# Patient Record
Sex: Male | Born: 1950 | Race: White | Hispanic: No | Marital: Married | Smoking: Never smoker
Health system: Southern US, Community
[De-identification: ages and names within clinical notes are randomized; demographics above are authoritative.]

## PROBLEM LIST (undated history)

## (undated) DIAGNOSIS — G930 Cerebral cysts: Secondary | ICD-10-CM

## (undated) DIAGNOSIS — H269 Unspecified cataract: Secondary | ICD-10-CM

## (undated) DIAGNOSIS — I5189 Other ill-defined heart diseases: Secondary | ICD-10-CM

## (undated) DIAGNOSIS — K831 Obstruction of bile duct: Secondary | ICD-10-CM

## (undated) DIAGNOSIS — I10 Essential (primary) hypertension: Secondary | ICD-10-CM

## (undated) DIAGNOSIS — R0602 Shortness of breath: Secondary | ICD-10-CM

## (undated) DIAGNOSIS — F419 Anxiety disorder, unspecified: Secondary | ICD-10-CM

## (undated) DIAGNOSIS — K219 Gastro-esophageal reflux disease without esophagitis: Secondary | ICD-10-CM

## (undated) DIAGNOSIS — C259 Malignant neoplasm of pancreas, unspecified: Secondary | ICD-10-CM

## (undated) DIAGNOSIS — D649 Anemia, unspecified: Secondary | ICD-10-CM

## (undated) DIAGNOSIS — F329 Major depressive disorder, single episode, unspecified: Secondary | ICD-10-CM

## (undated) DIAGNOSIS — Z87442 Personal history of urinary calculi: Secondary | ICD-10-CM

## (undated) DIAGNOSIS — Z9289 Personal history of other medical treatment: Secondary | ICD-10-CM

## (undated) DIAGNOSIS — F32A Depression, unspecified: Secondary | ICD-10-CM

## (undated) DIAGNOSIS — N189 Chronic kidney disease, unspecified: Secondary | ICD-10-CM

## (undated) DIAGNOSIS — I2699 Other pulmonary embolism without acute cor pulmonale: Secondary | ICD-10-CM

## (undated) DIAGNOSIS — K922 Gastrointestinal hemorrhage, unspecified: Secondary | ICD-10-CM

## (undated) DIAGNOSIS — E119 Type 2 diabetes mellitus without complications: Secondary | ICD-10-CM

## (undated) HISTORY — PX: ELBOW SURGERY: SHX618

## (undated) HISTORY — PX: APPENDECTOMY: SHX54

## (undated) HISTORY — PX: CHOLECYSTECTOMY: SHX55

## (undated) HISTORY — DX: Type 2 diabetes mellitus without complications: E11.9

## (undated) HISTORY — DX: Obstruction of bile duct: K83.1

## (undated) HISTORY — PX: EYE SURGERY: SHX253

## (undated) HISTORY — PX: WHIPPLE PROCEDURE: SHX2667

---

## 2008-11-17 DIAGNOSIS — E119 Type 2 diabetes mellitus without complications: Secondary | ICD-10-CM

## 2008-11-17 HISTORY — DX: Type 2 diabetes mellitus without complications: E11.9

## 2009-11-17 DIAGNOSIS — I2699 Other pulmonary embolism without acute cor pulmonale: Secondary | ICD-10-CM

## 2009-11-17 DIAGNOSIS — C259 Malignant neoplasm of pancreas, unspecified: Secondary | ICD-10-CM

## 2009-11-17 HISTORY — DX: Malignant neoplasm of pancreas, unspecified: C25.9

## 2009-11-17 HISTORY — DX: Other pulmonary embolism without acute cor pulmonale: I26.99

## 2009-11-17 HISTORY — PX: OTHER SURGICAL HISTORY: SHX169

## 2010-10-28 ENCOUNTER — Ambulatory Visit: Payer: Self-pay | Admitting: Internal Medicine

## 2010-10-28 ENCOUNTER — Ambulatory Visit (HOSPITAL_COMMUNITY)
Admission: RE | Admit: 2010-10-28 | Discharge: 2010-10-28 | Payer: Self-pay | Source: Home / Self Care | Attending: Internal Medicine | Admitting: Internal Medicine

## 2010-11-04 ENCOUNTER — Ambulatory Visit: Payer: Self-pay | Admitting: Internal Medicine

## 2010-11-17 DIAGNOSIS — K831 Obstruction of bile duct: Secondary | ICD-10-CM

## 2010-11-17 HISTORY — PX: ERCP W/ PLASTIC STENT PLACEMENT: SHX1522

## 2010-11-17 HISTORY — DX: Obstruction of bile duct: K83.1

## 2011-01-14 ENCOUNTER — Inpatient Hospital Stay (HOSPITAL_COMMUNITY)
Admission: AD | Admit: 2011-01-14 | Discharge: 2011-01-16 | DRG: 919 | Disposition: A | Discharge status: Home / Self Care | Payer: Self-pay | Source: Ambulatory Visit | Attending: Internal Medicine | Admitting: Internal Medicine

## 2011-01-14 DIAGNOSIS — C259 Malignant neoplasm of pancreas, unspecified: Secondary | ICD-10-CM | POA: Diagnosis present

## 2011-01-14 DIAGNOSIS — E119 Type 2 diabetes mellitus without complications: Secondary | ICD-10-CM | POA: Diagnosis present

## 2011-01-14 DIAGNOSIS — K831 Obstruction of bile duct: Secondary | ICD-10-CM

## 2011-01-14 DIAGNOSIS — T85898A Other specified complication of other internal prosthetic devices, implants and grafts, initial encounter: Principal | ICD-10-CM | POA: Diagnosis present

## 2011-01-14 DIAGNOSIS — Y831 Surgical operation with implant of artificial internal device as the cause of abnormal reaction of the patient, or of later complication, without mention of misadventure at the time of the procedure: Secondary | ICD-10-CM | POA: Diagnosis present

## 2011-01-14 DIAGNOSIS — E860 Dehydration: Secondary | ICD-10-CM | POA: Diagnosis present

## 2011-01-14 DIAGNOSIS — E876 Hypokalemia: Secondary | ICD-10-CM | POA: Diagnosis present

## 2011-01-14 DIAGNOSIS — R17 Unspecified jaundice: Secondary | ICD-10-CM

## 2011-01-14 DIAGNOSIS — D649 Anemia, unspecified: Secondary | ICD-10-CM | POA: Diagnosis present

## 2011-01-14 DIAGNOSIS — I10 Essential (primary) hypertension: Secondary | ICD-10-CM | POA: Diagnosis present

## 2011-01-14 LAB — SURGICAL PCR SCREEN
MRSA, PCR: NEGATIVE
Staphylococcus aureus: POSITIVE — AB

## 2011-01-14 LAB — URINALYSIS, ROUTINE W REFLEX MICROSCOPIC
Ketones, ur: 15 mg/dL — AB
Nitrite: NEGATIVE
Specific Gravity, Urine: 1.015 (ref 1.005–1.030)
pH: 5 (ref 5.0–8.0)

## 2011-01-14 LAB — URINE MICROSCOPIC-ADD ON

## 2011-01-15 ENCOUNTER — Inpatient Hospital Stay (HOSPITAL_COMMUNITY): Payer: Self-pay

## 2011-01-15 LAB — COMPREHENSIVE METABOLIC PANEL
ALT: 42 U/L (ref 0–53)
CO2: 27 mEq/L (ref 19–32)
Calcium: 8.4 mg/dL (ref 8.4–10.5)
Creatinine, Ser: 0.78 mg/dL (ref 0.4–1.5)
Glucose, Bld: 93 mg/dL (ref 70–99)
Sodium: 138 mEq/L (ref 135–145)
Total Protein: 5.7 g/dL — ABNORMAL LOW (ref 6.0–8.3)

## 2011-01-15 LAB — PREPARE RBC (CROSSMATCH)

## 2011-01-15 LAB — GLUCOSE, CAPILLARY
Glucose-Capillary: 119 mg/dL — ABNORMAL HIGH (ref 70–99)
Glucose-Capillary: 117 mg/dL — ABNORMAL HIGH (ref 70–99)
Glucose-Capillary: 82 mg/dL (ref 70–99)
Glucose-Capillary: 127 mg/dL — ABNORMAL HIGH (ref 70–99)
Glucose-Capillary: 123 mg/dL — ABNORMAL HIGH (ref 70–99)

## 2011-01-15 LAB — DIFFERENTIAL
Eosinophils Absolute: 0.1 10*3/uL (ref 0.0–0.7)
Eosinophils Relative: 1 % (ref 0–5)
Lymphs Abs: 0.4 10*3/uL — ABNORMAL LOW (ref 0.7–4.0)
Monocytes Absolute: 1.1 10*3/uL — ABNORMAL HIGH (ref 0.1–1.0)
Monocytes Relative: 8 % (ref 3–12)
Neutro Abs: 11.3 10*3/uL — ABNORMAL HIGH (ref 1.7–7.7)

## 2011-01-15 LAB — PROTIME-INR: INR: 1.75 — ABNORMAL HIGH (ref 0.00–1.49)

## 2011-01-15 LAB — MAGNESIUM: Magnesium: 1.5 mg/dL (ref 1.5–2.5)

## 2011-01-15 LAB — URINE CULTURE
Colony Count: NO GROWTH
Culture  Setup Time: 201202290246
Culture: NO GROWTH

## 2011-01-15 LAB — CBC
MCH: 31.3 pg (ref 26.0–34.0)
RDW: 16.3 % — ABNORMAL HIGH (ref 11.5–15.5)
WBC: 12.9 10*3/uL — ABNORMAL HIGH (ref 4.0–10.5)

## 2011-01-15 LAB — ABO/RH: ABO/RH(D): A NEG

## 2011-01-16 DIAGNOSIS — C259 Malignant neoplasm of pancreas, unspecified: Secondary | ICD-10-CM

## 2011-01-16 DIAGNOSIS — R17 Unspecified jaundice: Secondary | ICD-10-CM

## 2011-01-16 DIAGNOSIS — K831 Obstruction of bile duct: Secondary | ICD-10-CM

## 2011-01-16 LAB — CBC
HCT: 26.7 % — ABNORMAL LOW (ref 39.0–52.0)
Hemoglobin: 9.3 g/dL — ABNORMAL LOW (ref 13.0–17.0)
RBC: 3 MIL/uL — ABNORMAL LOW (ref 4.22–5.81)
WBC: 8.5 10*3/uL (ref 4.0–10.5)

## 2011-01-16 LAB — PROTIME-INR: Prothrombin Time: 15 seconds (ref 11.6–15.2)

## 2011-01-16 LAB — COMPREHENSIVE METABOLIC PANEL
ALT: 40 U/L (ref 0–53)
AST: 37 U/L (ref 0–37)
Alkaline Phosphatase: 159 U/L — ABNORMAL HIGH (ref 39–117)
CO2: 25 mEq/L (ref 19–32)
Calcium: 8.3 mg/dL — ABNORMAL LOW (ref 8.4–10.5)
Chloride: 101 mEq/L (ref 96–112)
GFR calc Af Amer: >60 mL/min (ref >60)
GFR calc non Af Amer: >60 mL/min (ref >60)
Glucose, Bld: 104 mg/dL — ABNORMAL HIGH (ref 70–99)
Potassium: 3.2 mEq/L — ABNORMAL LOW (ref 3.5–5.1)
Sodium: 136 mEq/L (ref 135–145)

## 2011-01-16 LAB — DIFFERENTIAL
Basophils Absolute: 0 10*3/uL (ref 0.0–0.1)
Basophils Relative: 1 % (ref 0–1)
Lymphocytes Relative: 6 % — ABNORMAL LOW (ref 12–46)
Monocytes Absolute: 0.7 10*3/uL (ref 0.1–1.0)
Neutro Abs: 7.1 10*3/uL (ref 1.7–7.7)
Neutrophils Relative %: 83 % — ABNORMAL HIGH (ref 43–77)

## 2011-01-16 LAB — GLUCOSE, CAPILLARY: Glucose-Capillary: 106 mg/dL — ABNORMAL HIGH (ref 70–99)

## 2011-01-19 LAB — CROSSMATCH
ABO/RH(D): A NEG
Antibody Screen: NEGATIVE
Unit division: 0
Unit division: 0

## 2011-01-19 LAB — CULTURE, BLOOD (ROUTINE X 2)
Culture: NO GROWTH
Culture: NO GROWTH

## 2011-01-28 LAB — BASIC METABOLIC PANEL
CO2: 25 mEq/L (ref 19–32)
Calcium: 9.4 mg/dL (ref 8.4–10.5)
Chloride: 99 mEq/L (ref 96–112)
Creatinine, Ser: 1.16 mg/dL (ref 0.4–1.5)
GFR calc Af Amer: >60 mL/min (ref >60)
Glucose, Bld: 117 mg/dL — ABNORMAL HIGH (ref 70–99)
Sodium: 134 mEq/L — ABNORMAL LOW (ref 135–145)

## 2011-01-28 LAB — HEPATIC FUNCTION PANEL
AST: 217 U/L — ABNORMAL HIGH (ref 0–37)
Albumin: 3.9 g/dL (ref 3.5–5.2)
Alkaline Phosphatase: 173 U/L — ABNORMAL HIGH (ref 39–117)
Bilirubin, Direct: 8.6 mg/dL — ABNORMAL HIGH (ref 0.0–0.3)
Total Bilirubin: 13.5 mg/dL — ABNORMAL HIGH (ref 0.3–1.2)

## 2011-02-09 NOTE — Op Note (Signed)
  [name], [name]               ACCOUNT NO.:  [id number]  MEDICAL RECORD NO.:  [id number]           PATIENT TYPE:  I  LOCATION:  A335                          FACILITY:  [facility]  PHYSICIAN:  Lionel December, M.D.    DATE OF BIRTH:  19-Nov-1950  DATE OF PROCEDURE:  01/15/2011 DATE OF DISCHARGE:                              OPERATIVE REPORT   PROCEDURE:  Endoscopic-retrograde cholangiopancreatography with biliary stent exchange.  INDICATION:  [name] is a 60 year old Caucasian male who presents with jaundice.  His intrahepatic biliary system is dilated.  He has history of pancreatic carcinoma and had stent placed 10 weeks ago.  It is suspected that this stent has occluded.  He has completed preoperative radiation therapy and midway with this chemo.  He is to have pancreatic surgery by Dr. Dimas Aguas, when he is completed his chemotherapy.  He is undergoing therapeutic ERCP.  Procedures risk were reviewed with the patient.  Informed consent was obtained.  MEDS FOR SEDATION/ANESTHESIA:  Please see anesthesia records for details.  FINDINGS:  Procedure was performed in the OR.  The patient was placed under anesthesia, intubated and turned in semi-prone position. Therapeutic Pentax video duodenoscope was passed to oropharynx without difficulty into esophagus.  Limited view of esophageal mucosa revealed no abnormality.  Antral mucosa was normal.  Pyloric channel was patent. Bulbar and postbulbar mucosa also was normal.  Scope was passed second part of duodenum where the stent was in place and it had lot of debris in the site port as well as and the end port.  It was caught with snared and removed under fluoroscopic control.  Endoscope was passed again and CBD cannulated with RX 44 autumn and 03-5 Hydra Jagwire.  There was cut off in the proximal common bile duct and system was dilated upstream although it was only partially filled just to outline it.  This stricture was estimated to be about 3  cm long.  Using one-step Microvasive system, 10-French 7-cm long plastic stent was placed.  As the stent was deployed, there was a gush of contrast and some bile into the duodenum.  The endoscope was withdrawn.  The patient was extubated, taken to PACU.  He tolerated the procedure well.  FINAL DIAGNOSES: 1. Occluded biliary stents. 2. This stent was removed and another 10-French 7-cm long plastic     biliary stent placed for decompression. 3. The patient with known pancreatic carcinoma with CBD stricture.  RECOMMENDATIONS: 1. We will advance his diet today. 2. We will continue IV antibiotics until blood culture results back. 3. We will repeat his lab studies in a.m. 4. Unless his blood cultures were positive, he should be able to home     in a.m.     Lionel December, M.D.     NR/MEDQ  D:  01/15/2011  T:  01/15/2011  Job:  [id number]  cc:   Dr. Landry Corporal, [place]  DR. [name]  Electronically Signed by Lionel December M.D. on 02/09/2011 12:51:05 PM

## 2011-02-09 NOTE — H&P (Signed)
NAME:  [name], [name]               ACCOUNT NO.:  [id number]  MEDICAL RECORD NO.:  [id number]           PATIENT TYPE:  I  LOCATION:  A335                          FACILITY:  [facility]  PHYSICIAN:  Lionel December, M.D.    DATE OF BIRTH:  1951-08-30  DATE OF ADMISSION:  01/14/2011 DATE OF DISCHARGE:  LH                             HISTORY & PHYSICAL   PRESENTING COMPLAINT:  Fever, jaundice nausea, anorexia in a patient with known history of pancreatic carcinoma who is undergoing preop chemo, who also has biliary stent in place.  Please note, the patient was transferred from emergency room at [facility] to my service here.  HISTORY OF PRESENT ILLNESS:  Saleem is a 60 year old Caucasian male whom I initially saw at request by, Dr. Halina Maidens of [place], [place] for painless jaundice.  He had ERCP with biliary stenting back on October 30, 2011, at [facility].  This took care of his jaundice.  His prior CAT scan had not shown a definite or discrete lesion.  He subsequently had EUS and FNA by, Dr. Lanell Matar, of [facility] and confirmed to have adenocarcinoma.  He was subsequently seen by, Dr. [name] of [facility], and he felt the patient would benefit from preop chemo and radiation prior to surgery. The patient was subsequently seen by, Dr. Wynonia Lawman of [place], [place].  The patient has completed radiation therapy on December 14, 2010, and he has received three cycles of chemo.  He tells me that he is receiving gemcitabine.  I has not received any records from [place].  The patient has not been feeling well for last several days.  He was admitted to [facility] last week which is reviewed under past medical history and discharged on an antibiotic.  They felt that probably he had urosepsis or UTI based on abnormal scan showing bilateral renal stranding.  This morning, the patient was taken to emergency room by his wife.  He states he noted that he was yellow 2  days ago.  He also noted his stool to be clay color and urine was very dark.  He has had very poor appetite.  Every time, he tries to eat or drink anything, he gets nauseated and start sneezing.  He has not experienced any chest or abdominal pain.  He did have a temperature of over 101 two days ago.  He was evaluated by, Dr. Darrol Angel, in emergency room at [facility].  He was afebrile.  According to Dr. Florian Buff, he did not appear to be acutely ill.  He had lab studies, his WBC was 14.7, H and H was 8.4 and 24.2, his platelet count was 290,000.  He had 84 segs.  Sodium was 133, potassium was 3.0, chloride 99, CO2 of 25.  His BUN was 13, creatinine 0.56, glucose 108, calcium 8.4, total bilirubin was 15.4, AP 181, SGOT 47, SGPT 51, albumin was 2.6, amylase 14, lipase 10.  Urinalysis reveals specific gravity of 1.020, trace ketones, large amount of bilirubin.  He also had upper abdominal ultrasound which showed dilated intrahepatic biliary radicals and proximal segment of common bile duct or  hepatic duct.  Dr. Florian Buff contacted me and arrangements were made for the patient to be transferred to this facility.  The patient states he has not fell for several days.  Last week, he had diarrhea, but now his stools are formed.  He denies dysuria, hematuria. He has lost 7 pounds since I last saw him about 10 weeks ago.  He denies chest pain or shortness of breath.  He just feels very tired and he does not have any energy.  He states he has not been able to sleep for the last week or so.  Dr. Doyne Keel, who discharged him from [facility] last week gave him Ambien, but he states it has not done anything.  Home meds include: 1. Metformin 1 g p.o. b.i.d. 2. Accupril 80 mg daily. 3. Asa p.r.n. 4. Nexium 40 mg daily or every other day. 5. Keflex 500 mg p.o. t.i.d. and he still has few doses left. 6. Prochlorperazine 5 mg t.i.d. p.r.n. 7. He also has hydrocodone/APAP that he has not taken very often.  PAST MEDICAL  HISTORY:  He has hypertension and has had type 2 diabetes which was diagnosed about 2 years ago.  He had appendectomy 40 years ago.  He was diagnosed with pancreatic adenocarcinoma in December last year, and felt to have resectable disease, currently undergoing preop chemotherapy and plan is for him to have three more cycles, he has already completed radiation therapy.  Recent hospitalization at [facility] from January 08, 2011, and felt to have early sepsis syndrome with hypertension and tachycardia secondary to E. coli urinary tract infection.  He also had anemia, felt to be multifactorial.  ALLERGIES:  NK.  FAMILY HISTORY:  Both parents are deceased.  Mother died at 98 of coronary artery disease and father died of MI in his 16s.  He has one brother with COPD, but he smokes three packs a day.  SOCIAL HISTORY:  He is married.  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  He does not smoke cigarettes or drink alcohol.  He has a daughter who has type 1 diabetes.  OBJECTIVE:  VITAL SIGNS:  Weight 84 kg which is equal to 186 pounds.  He is 72 inches tall, pulse 97 per minute, blood pressure 148/68, respirations 20, and admission temp was 98.2, and I rechecked it, during exam is 101.2. HEENT:  Conjunctivae is pink.  Sclera is deeply icteric.  Oral pharyngeal mucosa is normal.  He is edentulous.  He has dentures, but he is not wearing at the present time.  No neck masses or thyromegaly noted.  CARDIAC:  With regular rhythm.  Normal S1 and S2.  No murmur or gallop noted. LUNGS:  Clear to auscultation. ABDOMEN:  Symmetrical.  Bowel sounds are normal.  On palpation soft abdomen.  He has a mild tenderness below the right costal margin, but liver does not appear to be enlarged. EXTREMITIES:  No peripheral edema or clubbing noted.  Lab data from [facility] as above.  Ultrasound findings as above.  Please note that no mass was apparent on ultrasound in the pancreas.  ASSESSMENT:   Chayne is 60 year old Caucasian male with history of pancreatic adenocarcinoma who is undergoing preop chemotherapy and he is already completed radiation therapy last month who had plastic biliary stent about 10 weeks ago.  He now presents with jaundice.  He has all the signs and symptoms of cholangitis.  He may be dehydrated.  He also is hypokalemic.  His anion gap is only  12, so therefore he does not appear to have diabetic ketoacidosis.  PLAN: 1. We will hydrate with normal saline and also replace KCl.  We will     do blood cultures x2 and urine culture, and a start him on Zosyn     3.375 g IV q6.  Please note that he was given a gram of Rocephin on     arrival before I was able to see him. 2. He will also be typed and crossmatched for 2 units.  Lab studies     will be repeated in a.m. including CBC, Chem-20, and INR. 3. We will continue his Accupril, but half the dose, given Protonix 40     mg IV q.12 hours. 4. He will undergo ERCP with stent change in a.m.     Lionel December, M.D.     NR/MEDQ  D:  01/14/2011  T:  01/14/2011  Job:  [id number]  cc:   Dixie Dials, NP [facility]  Dr. Landry Corporal, [place]  Dr. [name] Department of Surgery [facility] [place], [place]  Electronically Signed by Lionel December M.D. on 02/09/2011 12:49:52 PM

## 2011-02-17 NOTE — Discharge Summary (Signed)
[name], [name]               ACCOUNT NO.:  [id number]  MEDICAL RECORD NO.:  [id number]           PATIENT TYPE:  I  LOCATION:  A335                          FACILITY:  [facility]  PHYSICIAN:  Lionel December, M.D.    DATE OF BIRTH:  May 02, 1951  DATE OF ADMISSION:  01/14/2011 DATE OF DISCHARGE:  03/01/2012LH                              DISCHARGE SUMMARY   DISCHARGE DIAGNOSES: 1. Jaundice secondary to occluded biliary stent. 2. Pancreatic adenocarcinoma diagnosed in December, 2011. 3. Anemia possibly secondary to chemotherapy. 4. Hypokalemia. 5. Dehydration. 6. Hypertension. 7. Diabetes mellitus. 8. Chronic gastroesophageal reflux disease.  CONDITION AT THE TIME OF DISCHARGE:  Much improved.  PRINCIPAL PROCEDURE:  ERCP with biliary stent change by Dr. Karilyn Cota on January 15, 2011.  DISCHARGE MEDICATIONS: 1. Metformin 1 g p.o. b.i.d. 2. Accupril 80 mg p.o. daily. 3. KCL 20 mEq daily for 7 days. 4. Tylenol 500 mg q.i.d. p.r.n., total dose allowed 2 g per day. 5. Nexium 40 mg p.o. q.a.m. 6. MVI daily.  The patient's chemotherapeutic agent is on hold.  HOSPITAL COURSE:  [name] is a 60 year old Caucasian male who was diagnosed with pancreatic adenocarcinoma when he presented with painless jaundice in December 2011.  His biliary system was decompressed by placing a stent.  Since then he has been evaluated at [facility] and felt to have a resectable disease.  He has been undergoing preop chemoradiation. He has been under care of Dr. Wynonia Lawman of [place], [place].  He had completed the radiation therapy on December 14, 2010, and he had received 3 cycles of chemotherapy, possibly gemcitabine.  The patient was recently treated for urinary tract infection at [facility].  At that time he had renal stranding on CT.  This time he was reported to [facility] emergency room with 2-day history of jaundice.  He also had anorexia, poor appetite and nausea and vomiting/heaving.  On admission, he appeared to  be acutely ill.  His temperature soon after admission was 101.2.  He was also anemic with hemoglobin of 7.7 g and hematocrit of 22.1.  His WBC was 12.9 and platelet count was 283,000. There was no history of hematuria, melena or rectal bleeding or hematemesis.  I felt his anemia was secondary to chemotherapy.  The patient was typed and crossmatch.  He also had urine and blood cultures and was begun on Zosyn.  He was given 2 units of PRBCs.  He was also hydrated.  His serum potassium was low at 3.4.  He was also given KCl. The patient's INR was 1.75.  This was felt to be due to obstructive biliary process.  He was given 10 mg of vitamin K subcu.  This corrected his coagulopathy with INR down to 1.16 on admission.  By hospital day #2, the patient was feeling much better and he underwent ERCP.  His stent was occluded 100%.  This was removed and a new 10-French 7-cm long plastic stent was placed.  Did not see any drainage of mucopurulent liquid.  Postprocedure, the patient felt much better.  His diet was advanced.  He had lab studies on January 16, 2011, and  his bilirubin was down from 13.5 to 7.2.  As noted above, his INR was also improved to 1.16.  His hemoglobin was 9.3, hematocrit 26.7, platelet count was 274K and WBC 8.5.  His serum potassium was still low at 3.2.  Serum magnesium was normal at 1.5.  The patient tolerated diabetic diet.  His blood and urine cultures were negative.  On admission, he did have small amount of ketones in the urine but his CO2 was normal.  Therefore, I felt that this was secondary to dehydration rather than DKA or nonketotic hyperosmolar state.  While the patient was in the hospital, I did confirm with Dr. Jimmye Norman, his oncologist, and he agreed with holding his chemotherapeutic agent.  The patient was discharged in much improved condition.  He is to see Dr. Jimmye Norman in 1 week.  He will follow up with me in 6 weeks.  Also I have recommended he should consider  undergoing a colonoscopy for screening purposes prior to pancreatic surgery.     Lionel December, M.D.     NR/MEDQ  D:  02/09/2011  T:  02/09/2011  Job:  [id number]  cc:   Verne Spurr [facility] [address] [place], [place]  [name] Department of Surgery, [facility], [facility]  Electronically Signed by Lionel December M.D. on 02/17/2011 09:31:35 AM

## 2012-08-28 ENCOUNTER — Inpatient Hospital Stay: Payer: Self-pay | Admitting: Psychiatry

## 2012-08-28 LAB — COMPREHENSIVE METABOLIC PANEL
Anion Gap: 7 (ref 7–16)
Bilirubin,Total: 0.3 mg/dL (ref 0.2–1.0)
Calcium, Total: 9 mg/dL (ref 8.5–10.1)
Co2: 28 mmol/L (ref 21–32)
Creatinine: 1.89 mg/dL — ABNORMAL HIGH (ref 0.60–1.30)
EGFR (African American): 43 — ABNORMAL LOW
EGFR (Non-African Amer.): 37 — ABNORMAL LOW
Glucose: 77 mg/dL (ref 65–99)
SGOT(AST): 13 U/L — ABNORMAL LOW (ref 15–37)
SGPT (ALT): 18 U/L (ref 12–78)
Sodium: 141 mmol/L (ref 136–145)
Total Protein: 7.6 g/dL (ref 6.4–8.2)

## 2012-08-28 LAB — CBC
HGB: 9.9 g/dL — ABNORMAL LOW (ref 13.0–18.0)
MCH: 32.4 pg (ref 26.0–34.0)
MCV: 94 fL (ref 80–100)
Platelet: 175 10*3/uL (ref 150–440)
RBC: 3.06 10*6/uL — ABNORMAL LOW (ref 4.40–5.90)

## 2012-08-28 LAB — ETHANOL
Ethanol %: <0.003 % (ref 0.000–0.080)
Ethanol: <3 mg/dL

## 2012-08-28 LAB — ACETAMINOPHEN LEVEL: Acetaminophen: <2 ug/mL

## 2012-08-28 LAB — SALICYLATE LEVEL: Salicylates, Serum: <1.7 mg/dL

## 2012-08-28 LAB — DRUG SCREEN, URINE
Barbiturates, Ur Screen: NEGATIVE (ref ?–200)
Benzodiazepine, Ur Scrn: NEGATIVE (ref ?–200)
Cannabinoid 50 Ng, Ur ~~LOC~~: NEGATIVE (ref ?–50)
Cocaine Metabolite,Ur ~~LOC~~: NEGATIVE (ref ?–300)
Methadone, Ur Screen: NEGATIVE (ref ?–300)
Phencyclidine (PCP) Ur S: NEGATIVE (ref ?–25)
Tricyclic, Ur Screen: NEGATIVE (ref ?–1000)

## 2012-09-01 LAB — BASIC METABOLIC PANEL
Anion Gap: 6 — ABNORMAL LOW (ref 7–16)
Chloride: 101 mmol/L (ref 98–107)
Co2: 32 mmol/L (ref 21–32)
Creatinine: 2.22 mg/dL — ABNORMAL HIGH (ref 0.60–1.30)
EGFR (Non-African Amer.): 31 — ABNORMAL LOW
Sodium: 139 mmol/L (ref 136–145)

## 2012-09-01 LAB — CBC WITH DIFFERENTIAL/PLATELET
Basophil #: 0 10*3/uL (ref 0.0–0.1)
Eosinophil #: 0.2 10*3/uL (ref 0.0–0.7)
Eosinophil %: 3.8 %
HCT: 28.8 % — ABNORMAL LOW (ref 40.0–52.0)
Lymphocyte #: 0.7 10*3/uL — ABNORMAL LOW (ref 1.0–3.6)
MCHC: 34.1 g/dL (ref 32.0–36.0)
MCV: 93 fL (ref 80–100)
Monocyte #: 0.5 x10 3/mm (ref 0.2–1.0)
Monocyte %: 9.2 %
Neutrophil #: 3.5 10*3/uL (ref 1.4–6.5)
Platelet: 176 10*3/uL (ref 150–440)
RBC: 3.08 10*6/uL — ABNORMAL LOW (ref 4.40–5.90)
RDW: 13.7 % (ref 11.5–14.5)
WBC: 4.9 10*3/uL (ref 3.8–10.6)

## 2012-10-04 LAB — CBC
HCT: 28 % — ABNORMAL LOW (ref 40.0–52.0)
MCH: 32.4 pg (ref 26.0–34.0)
MCHC: 35.2 g/dL (ref 32.0–36.0)
MCV: 92 fL (ref 80–100)
Platelet: 191 10*3/uL (ref 150–440)
RBC: 3.04 10*6/uL — ABNORMAL LOW (ref 4.40–5.90)

## 2012-10-04 LAB — COMPREHENSIVE METABOLIC PANEL
Albumin: 4.2 g/dL (ref 3.4–5.0)
Anion Gap: 8 (ref 7–16)
BUN: 27 mg/dL — ABNORMAL HIGH (ref 7–18)
Bilirubin,Total: 0.2 mg/dL (ref 0.2–1.0)
Chloride: 106 mmol/L (ref 98–107)
Co2: 25 mmol/L (ref 21–32)
EGFR (African American): 48 — ABNORMAL LOW
EGFR (Non-African Amer.): 41 — ABNORMAL LOW
Osmolality: 283 (ref 275–301)
Potassium: 3.7 mmol/L (ref 3.5–5.1)
SGOT(AST): 22 U/L (ref 15–37)
SGPT (ALT): 33 U/L (ref 12–78)

## 2012-10-04 LAB — ETHANOL
Ethanol %: <0.003 % (ref 0.000–0.080)
Ethanol: <3 mg/dL

## 2012-10-04 LAB — TSH: Thyroid Stimulating Horm: 1.51 u[IU]/mL

## 2012-10-05 ENCOUNTER — Inpatient Hospital Stay: Payer: Self-pay | Admitting: Psychiatry

## 2012-10-05 LAB — URINALYSIS, COMPLETE
Bilirubin,UR: NEGATIVE
Ketone: NEGATIVE
Ph: 5 (ref 4.5–8.0)
Protein: NEGATIVE
Squamous Epithelial: 5

## 2012-10-05 LAB — DRUG SCREEN, URINE
Amphetamines, Ur Screen: NEGATIVE (ref ?–1000)
Barbiturates, Ur Screen: NEGATIVE (ref ?–200)
Benzodiazepine, Ur Scrn: NEGATIVE (ref ?–200)
Cannabinoid 50 Ng, Ur ~~LOC~~: NEGATIVE (ref ?–50)
Cocaine Metabolite,Ur ~~LOC~~: NEGATIVE (ref ?–300)
MDMA (Ecstasy)Ur Screen: NEGATIVE (ref ?–500)
Methadone, Ur Screen: NEGATIVE (ref ?–300)
Opiate, Ur Screen: NEGATIVE (ref ?–300)
Tricyclic, Ur Screen: NEGATIVE (ref ?–1000)

## 2012-10-20 ENCOUNTER — Encounter (HOSPITAL_COMMUNITY): Payer: Self-pay | Admitting: Emergency Medicine

## 2012-10-20 ENCOUNTER — Inpatient Hospital Stay (HOSPITAL_COMMUNITY)
Admission: EM | Admit: 2012-10-20 | Discharge: 2012-10-25 | DRG: 312 | Disposition: A | Discharge status: Skilled Nursing Facility | Payer: MEDICAID | Attending: Internal Medicine | Admitting: Internal Medicine

## 2012-10-20 ENCOUNTER — Emergency Department (HOSPITAL_COMMUNITY): Payer: Self-pay

## 2012-10-20 ENCOUNTER — Inpatient Hospital Stay (HOSPITAL_COMMUNITY): Payer: Self-pay

## 2012-10-20 DIAGNOSIS — F329 Major depressive disorder, single episode, unspecified: Secondary | ICD-10-CM | POA: Diagnosis present

## 2012-10-20 DIAGNOSIS — R627 Adult failure to thrive: Secondary | ICD-10-CM | POA: Diagnosis present

## 2012-10-20 DIAGNOSIS — F3289 Other specified depressive episodes: Secondary | ICD-10-CM | POA: Diagnosis present

## 2012-10-20 DIAGNOSIS — I951 Orthostatic hypotension: Secondary | ICD-10-CM | POA: Diagnosis present

## 2012-10-20 DIAGNOSIS — R55 Syncope and collapse: Secondary | ICD-10-CM

## 2012-10-20 DIAGNOSIS — G930 Cerebral cysts: Secondary | ICD-10-CM | POA: Diagnosis present

## 2012-10-20 DIAGNOSIS — E41 Nutritional marasmus: Secondary | ICD-10-CM | POA: Diagnosis present

## 2012-10-20 DIAGNOSIS — K449 Diaphragmatic hernia without obstruction or gangrene: Secondary | ICD-10-CM | POA: Diagnosis present

## 2012-10-20 DIAGNOSIS — K5900 Constipation, unspecified: Secondary | ICD-10-CM | POA: Diagnosis present

## 2012-10-20 DIAGNOSIS — Z9221 Personal history of antineoplastic chemotherapy: Secondary | ICD-10-CM

## 2012-10-20 DIAGNOSIS — I10 Essential (primary) hypertension: Secondary | ICD-10-CM | POA: Diagnosis present

## 2012-10-20 DIAGNOSIS — IMO0002 Reserved for concepts with insufficient information to code with codable children: Secondary | ICD-10-CM

## 2012-10-20 DIAGNOSIS — W19XXXA Unspecified fall, initial encounter: Secondary | ICD-10-CM | POA: Diagnosis present

## 2012-10-20 DIAGNOSIS — E861 Hypovolemia: Secondary | ICD-10-CM | POA: Diagnosis present

## 2012-10-20 DIAGNOSIS — K219 Gastro-esophageal reflux disease without esophagitis: Secondary | ICD-10-CM | POA: Diagnosis present

## 2012-10-20 DIAGNOSIS — Z23 Encounter for immunization: Secondary | ICD-10-CM

## 2012-10-20 DIAGNOSIS — R630 Anorexia: Secondary | ICD-10-CM | POA: Diagnosis present

## 2012-10-20 DIAGNOSIS — N179 Acute kidney failure, unspecified: Secondary | ICD-10-CM

## 2012-10-20 DIAGNOSIS — D649 Anemia, unspecified: Secondary | ICD-10-CM

## 2012-10-20 DIAGNOSIS — Z8509 Personal history of malignant neoplasm of other digestive organs: Secondary | ICD-10-CM

## 2012-10-20 DIAGNOSIS — R9431 Abnormal electrocardiogram [ECG] [EKG]: Secondary | ICD-10-CM

## 2012-10-20 DIAGNOSIS — K31819 Angiodysplasia of stomach and duodenum without bleeding: Secondary | ICD-10-CM | POA: Diagnosis present

## 2012-10-20 DIAGNOSIS — E119 Type 2 diabetes mellitus without complications: Secondary | ICD-10-CM | POA: Diagnosis present

## 2012-10-20 DIAGNOSIS — E43 Unspecified severe protein-calorie malnutrition: Secondary | ICD-10-CM | POA: Diagnosis present

## 2012-10-20 DIAGNOSIS — R634 Abnormal weight loss: Secondary | ICD-10-CM | POA: Diagnosis present

## 2012-10-20 DIAGNOSIS — R1084 Generalized abdominal pain: Secondary | ICD-10-CM | POA: Diagnosis present

## 2012-10-20 DIAGNOSIS — R6889 Other general symptoms and signs: Secondary | ICD-10-CM | POA: Diagnosis present

## 2012-10-20 DIAGNOSIS — S0003XA Contusion of scalp, initial encounter: Secondary | ICD-10-CM | POA: Diagnosis present

## 2012-10-20 DIAGNOSIS — R300 Dysuria: Secondary | ICD-10-CM | POA: Diagnosis present

## 2012-10-20 DIAGNOSIS — I5189 Other ill-defined heart diseases: Secondary | ICD-10-CM

## 2012-10-20 HISTORY — DX: Gastro-esophageal reflux disease without esophagitis: K21.9

## 2012-10-20 HISTORY — DX: Major depressive disorder, single episode, unspecified: F32.9

## 2012-10-20 HISTORY — DX: Malignant neoplasm of pancreas, unspecified: C25.9

## 2012-10-20 HISTORY — DX: Depression, unspecified: F32.A

## 2012-10-20 HISTORY — DX: Anemia, unspecified: D64.9

## 2012-10-20 HISTORY — DX: Essential (primary) hypertension: I10

## 2012-10-20 HISTORY — DX: Other ill-defined heart diseases: I51.89

## 2012-10-20 LAB — CBC WITH DIFFERENTIAL/PLATELET
Eosinophils Absolute: 0 10*3/uL (ref 0.0–0.7)
Eosinophils Relative: 1 % (ref 0–5)
HCT: 25.2 % — ABNORMAL LOW (ref 39.0–52.0)
Lymphocytes Relative: 11 % — ABNORMAL LOW (ref 12–46)
Lymphs Abs: 0.6 10*3/uL — ABNORMAL LOW (ref 0.7–4.0)
MCH: 31.3 pg (ref 26.0–34.0)
MCV: 91.6 fL (ref 78.0–100.0)
Monocytes Absolute: 0.4 10*3/uL (ref 0.1–1.0)
Monocytes Relative: 8 % (ref 3–12)
RBC: 2.75 MIL/uL — ABNORMAL LOW (ref 4.22–5.81)
WBC: 5.1 10*3/uL (ref 4.0–10.5)

## 2012-10-20 LAB — URINALYSIS, ROUTINE W REFLEX MICROSCOPIC
Hgb urine dipstick: NEGATIVE
Nitrite: NEGATIVE
Protein, ur: NEGATIVE mg/dL
Urobilinogen, UA: 0.2 mg/dL (ref 0.0–1.0)

## 2012-10-20 LAB — CK: Total CK: 108 U/L (ref 7–232)

## 2012-10-20 LAB — GLUCOSE, CAPILLARY: Glucose-Capillary: 88 mg/dL (ref 70–99)

## 2012-10-20 LAB — COMPREHENSIVE METABOLIC PANEL
ALT: 145 U/L — ABNORMAL HIGH (ref 0–53)
BUN: 45 mg/dL — ABNORMAL HIGH (ref 6–23)
CO2: 25 mEq/L (ref 19–32)
Calcium: 9.3 mg/dL (ref 8.4–10.5)
Creatinine, Ser: 1.89 mg/dL — ABNORMAL HIGH (ref 0.50–1.35)
GFR calc Af Amer: 43 mL/min — ABNORMAL LOW (ref >90)
GFR calc non Af Amer: 37 mL/min — ABNORMAL LOW (ref >90)
Glucose, Bld: 139 mg/dL — ABNORMAL HIGH (ref 70–99)

## 2012-10-20 LAB — RETICULOCYTES: Retic Ct Pct: 0.7 % (ref 0.4–3.1)

## 2012-10-20 LAB — TROPONIN I: Troponin I: <0.3 ng/mL (ref <0.30)

## 2012-10-20 MED ORDER — METOPROLOL SUCCINATE ER 25 MG PO TB24
25.0000 mg | ORAL_TABLET | Freq: Every day | ORAL | Status: DC
  Administered 2012-10-21 – 2012-10-25 (×5): 25 mg via ORAL
  Filled 2012-10-20 (×7): qty 1

## 2012-10-20 MED ORDER — GUAIFENESIN-DM 100-10 MG/5ML PO SYRP: 5.0000 mL | ORAL_SOLUTION | ORAL | Status: DC | PRN

## 2012-10-20 MED ORDER — INSULIN ASPART 100 UNIT/ML ~~LOC~~ SOLN
0.0000 [IU] | Freq: Three times a day (TID) | SUBCUTANEOUS | Status: DC
  Administered 2012-10-23 – 2012-10-24 (×2): 1 [IU] via SUBCUTANEOUS

## 2012-10-20 MED ORDER — ACETAMINOPHEN 650 MG RE SUPP: 650.0000 mg | Freq: Four times a day (QID) | RECTAL | Status: DC | PRN

## 2012-10-20 MED ORDER — ONDANSETRON HCL 4 MG/2ML IJ SOLN
4.0000 mg | Freq: Four times a day (QID) | INTRAMUSCULAR | Status: DC | PRN
  Administered 2012-10-21 – 2012-10-22 (×3): 4 mg via INTRAVENOUS
  Filled 2012-10-20 (×3): qty 2

## 2012-10-20 MED ORDER — ACETAMINOPHEN 325 MG PO TABS
650.0000 mg | ORAL_TABLET | Freq: Four times a day (QID) | ORAL | Status: DC | PRN
  Administered 2012-10-24 – 2012-10-25 (×2): 650 mg via ORAL
  Filled 2012-10-20 (×2): qty 2

## 2012-10-20 MED ORDER — ALBUTEROL SULFATE (5 MG/ML) 0.5% IN NEBU: 2.5000 mg | INHALATION_SOLUTION | RESPIRATORY_TRACT | Status: DC | PRN

## 2012-10-20 MED ORDER — MORPHINE SULFATE 2 MG/ML IJ SOLN: 2.0000 mg | INTRAMUSCULAR | Status: DC | PRN

## 2012-10-20 MED ORDER — ADULT MULTIVITAMIN W/MINERALS CH
1.0000 | ORAL_TABLET | Freq: Every day | ORAL | Status: DC
  Administered 2012-10-21 – 2012-10-25 (×5): 1 via ORAL
  Filled 2012-10-20 (×5): qty 1

## 2012-10-20 MED ORDER — ALUM & MAG HYDROXIDE-SIMETH 200-200-20 MG/5ML PO SUSP
30.0000 mL | Freq: Four times a day (QID) | ORAL | Status: DC | PRN
  Administered 2012-10-22: 30 mL via ORAL
  Filled 2012-10-20 (×2): qty 30

## 2012-10-20 MED ORDER — OXYCODONE HCL 5 MG PO TABS
5.0000 mg | ORAL_TABLET | ORAL | Status: DC | PRN
  Filled 2012-10-20: qty 1

## 2012-10-20 MED ORDER — POTASSIUM CHLORIDE IN NACL 20-0.9 MEQ/L-% IV SOLN
INTRAVENOUS | Status: DC
  Administered 2012-10-20 – 2012-10-23 (×5): via INTRAVENOUS

## 2012-10-20 MED ORDER — ONDANSETRON HCL 4 MG PO TABS
4.0000 mg | ORAL_TABLET | Freq: Four times a day (QID) | ORAL | Status: DC | PRN
  Administered 2012-10-24: 4 mg via ORAL
  Filled 2012-10-20: qty 1

## 2012-10-20 MED ORDER — SODIUM CHLORIDE 0.9 % IV BOLUS (SEPSIS)
1000.0000 mL | Freq: Once | INTRAVENOUS | Status: AC
  Administered 2012-10-20: 1000 mL via INTRAVENOUS

## 2012-10-20 MED ORDER — BISACODYL 10 MG RE SUPP
10.0000 mg | Freq: Every day | RECTAL | Status: DC | PRN
  Administered 2012-10-22 – 2012-10-24 (×2): 10 mg via RECTAL
  Filled 2012-10-20 (×2): qty 1

## 2012-10-20 MED ORDER — POLYETHYLENE GLYCOL 3350 17 G PO PACK
17.0000 g | PACK | Freq: Every day | ORAL | Status: DC
  Administered 2012-10-21 – 2012-10-25 (×5): 17 g via ORAL
  Filled 2012-10-20 (×5): qty 1

## 2012-10-20 MED ORDER — TRAZODONE HCL 50 MG PO TABS
25.0000 mg | ORAL_TABLET | Freq: Every evening | ORAL | Status: DC | PRN
  Administered 2012-10-20 – 2012-10-24 (×5): 25 mg via ORAL
  Filled 2012-10-20 (×6): qty 1

## 2012-10-20 MED ORDER — PANTOPRAZOLE SODIUM 40 MG PO TBEC
40.0000 mg | DELAYED_RELEASE_TABLET | Freq: Every day | ORAL | Status: DC
  Administered 2012-10-21 – 2012-10-25 (×5): 40 mg via ORAL
  Filled 2012-10-20 (×5): qty 1

## 2012-10-20 MED ORDER — INSULIN ASPART 100 UNIT/ML ~~LOC~~ SOLN: 0.0000 [IU] | Freq: Every day | SUBCUTANEOUS | Status: DC

## 2012-10-20 MED ORDER — PNEUMOCOCCAL VAC POLYVALENT 25 MCG/0.5ML IJ INJ
0.5000 mL | INJECTION | INTRAMUSCULAR | Status: AC
  Administered 2012-10-21: 0.5 mL via INTRAMUSCULAR
  Filled 2012-10-20: qty 0.5

## 2012-10-20 MED ORDER — SENNA 8.6 MG PO TABS
1.0000 | ORAL_TABLET | Freq: Two times a day (BID) | ORAL | Status: DC
  Administered 2012-10-20 – 2012-10-25 (×10): 8.6 mg via ORAL
  Filled 2012-10-20 (×14): qty 1

## 2012-10-20 NOTE — ED Notes (Addendum)
Pt comes from home after "multiple" syncopal episodes over past "several days". Pt reports right arm pain, bilateral leg/hip pain and headache. Pt also c/o constipation and states his last BM was 2 days ago. Pt denies SOB, chest pain. EMS reports pt was orthostatic pta. Pt states "I fee like my heart is racing". Pt is tachy during assessment.

## 2012-10-20 NOTE — ED Provider Notes (Signed)
History   This chart was scribed for Benny Lennert, MD by Charolett Bumpers, ED Scribe. The patient was seen in room APA09/APA09. Patient's care was started at 1352.   CSN: [id number]  Arrival date & time 10/20/12  1304   First MD Initiated Contact with Patient 10/20/12 1352      Chief Complaint  Patient presents with  . Loss of Consciousness   [name] is a 61 y.o. male who presents to the Emergency Department complaining of loss of consciousness. He states that he has had 20 syncopal episodes over the past week. He reports associated palpations, knee/elbow soreness and buttocks soreness. He states that he is standing prior to passing out. He denies any melena but reports he is constipated. He states that he also hit the back of his head. He states that EMS where checking his orthostatics when he became dizzy.   Patient is a 61 y.o. male presenting with syncope. The history is provided by the patient. No language interpreter was used.  Loss of Consciousness This is a new problem. The current episode started more than 2 days ago. The problem occurs daily. The problem has been gradually worsening. Pertinent negatives include no chest pain, no abdominal pain and no headaches. He has tried nothing for the symptoms.    Past Medical History  Diagnosis Date  . Hypertension   . Diabetes mellitus without complication   . Pancreatic cancer   . Depression     Past Surgical History  Procedure Date  . Pancreas surgery     History reviewed. No pertinent family history.  History  Substance Use Topics  . Smoking status: Never Smoker   . Smokeless tobacco: Never Used  . Alcohol Use: No      Review of Systems  Constitutional: Negative for fatigue.  HENT: Negative for congestion, sinus pressure and ear discharge.   Eyes: Negative for discharge.  Respiratory: Negative for cough.   Cardiovascular: Positive for palpitations and syncope. Negative for chest pain.   Gastrointestinal: Positive for constipation. Negative for abdominal pain and diarrhea.  Genitourinary: Negative for frequency and hematuria.  Musculoskeletal: Positive for arthralgias. Negative for back pain.  Skin: Negative for rash.  Neurological: Positive for syncope. Negative for seizures and headaches.  Hematological: Negative.   Psychiatric/Behavioral: Negative for hallucinations.  All other systems reviewed and are negative.    Allergies  Review of patient's allergies indicates no known allergies.  Home Medications  No current outpatient prescriptions on file.  BP 129/71  Pulse 129  Temp 98.2 F (36.8 C)  Resp 18  SpO2 99%  Physical Exam  Nursing note and vitals reviewed. Constitutional: He is oriented to person, place, and time. He appears well-developed.  HENT:  Head: Normocephalic.  Right Ear: External ear normal.  Left Ear: External ear normal.  Nose: Nose normal.  Mouth/Throat: Oropharynx is clear and moist.  Eyes: Conjunctivae normal and EOM are normal. No scleral icterus.  Neck: Neck supple. No thyromegaly present.  Cardiovascular: Regular rhythm and normal heart sounds.  Tachycardia present.  Exam reveals no gallop and no friction rub.   No murmur heard.      Slightly tachycardic.   Pulmonary/Chest: Effort normal and breath sounds normal. No stridor. He has no wheezes. He has no rales. He exhibits no tenderness.  Abdominal: Soft. Bowel sounds are normal. He exhibits no distension. There is no tenderness. There is no rebound.  Musculoskeletal: Normal range of motion. He exhibits no edema.  Lymphadenopathy:  He has no cervical adenopathy.  Neurological: He is oriented to person, place, and time. Coordination normal.  Skin: No rash noted. No erythema.       Mild bruising to left elbow, occipital head and bilaterally knees.   Psychiatric: He has a normal mood and affect. His behavior is normal.    ED Course  Procedures (including critical care  time)  DIAGNOSTIC STUDIES: Oxygen Saturation is 99% on room air, normal by my interpretation.    COORDINATION OF CARE:  14:00-Discussed planned course of treatment with the patient including a head CT, chest x-ray and blood work, who is agreeable at this time.    Results for orders placed during the hospital encounter of 10/20/12  CBC WITH DIFFERENTIAL      Component Value Range   WBC 5.1  4.0 - 10.5 K/uL   RBC 2.75 (*) 4.22 - 5.81 MIL/uL   Hemoglobin 8.6 (*) 13.0 - 17.0 g/dL   HCT 16.1 (*) 09.6 - 04.5 %   MCV 91.6  78.0 - 100.0 fL   MCH 31.3  26.0 - 34.0 pg   MCHC 34.1  30.0 - 36.0 g/dL   RDW 40.9  81.1 - 91.4 %   Platelets 169  150 - 400 K/uL   Neutrophils Relative 80 (*) 43 - 77 %   Neutro Abs 4.1  1.7 - 7.7 K/uL   Lymphocytes Relative 11 (*) 12 - 46 %   Lymphs Abs 0.6 (*) 0.7 - 4.0 K/uL   Monocytes Relative 8  3 - 12 %   Monocytes Absolute 0.4  0.1 - 1.0 K/uL   Eosinophils Relative 1  0 - 5 %   Eosinophils Absolute 0.0  0.0 - 0.7 K/uL   Basophils Relative 0  0 - 1 %   Basophils Absolute 0.0  0.0 - 0.1 K/uL  COMPREHENSIVE METABOLIC PANEL      Component Value Range   Sodium 137  135 - 145 mEq/L   Potassium 4.3  3.5 - 5.1 mEq/L   Chloride 104  96 - 112 mEq/L   CO2 25  19 - 32 mEq/L   Glucose, Bld 139 (*) 70 - 99 mg/dL   BUN 45 (*) 6 - 23 mg/dL   Creatinine, Ser 7.82 (*) 0.50 - 1.35 mg/dL   Calcium 9.3  8.4 - 95.6 mg/dL   Total Protein 6.9  6.0 - 8.3 g/dL   Albumin 3.8  3.5 - 5.2 g/dL   AST 74 (*) 0 - 37 U/L   ALT 145 (*) 0 - 53 U/L   Alkaline Phosphatase 56  39 - 117 U/L   Total Bilirubin 0.3  0.3 - 1.2 mg/dL   GFR calc non Af Amer 37 (*) >90 mL/min   GFR calc Af Amer 43 (*) >90 mL/min  TROPONIN I      Component Value Range   Troponin I <0.30  <0.30 ng/mL  ' No results found.   No diagnosis found.   .ededkg  Date: 10/20/2012  Rate: 123  Rhythm: sinus tachycardia  QRS Axis: left  Intervals: normal  ST/T Wave abnormalities: normal  Conduction  Disutrbances:none  Narrative Interpretation:   Old EKG Reviewed: none available   MDM     The chart was scribed for me under my direct supervision.  I personally performed the history, physical, and medical decision making and all procedures in the evaluation of this patient.Benny Lennert, MD 10/25/12 [phone]

## 2012-10-20 NOTE — H&P (Addendum)
[facility] History and Physical  [name] WUJ:[id number] DOB: 1951-09-25 DOA: 10/20/2012  Referring physician: Dr. Estell Harpin. PCP: Provider Not In System  Specialists: Gastroenterologist Josie Dixon, M.D.                      Oncologist at [facility].   Chief Complaint: Loss of consciousness.  HPI: [name] is a 61 y.o. male with a history significant for adenocarcinoma of the pancreas, status post resection and chemotherapy, diabetes mellitus, and hypertension, who presented to the emergency department today after losing consciousness on multiple occasions. Over the past week, he says that he passed out at least 20 times. Most of the time, he becomes lightheaded and then blacks out. Occasionally, there is no warning. In general, when he passes out, it is after he stands up from sitting or laying. He developed a knot on the back of his head from hitting his head on the floor. He has sore hips from falling so much. He denies nausea, vomiting, or diarrhea. He has had some pain with urination and urinary hesitancy. He has had occasional abdominal pain which he thinks may be associated with constipation. His last bowel movement was 3 days ago. On occasion, his stool is black and tarry. He has had a 50 pound weight loss this year, unintentional. He denies difficulty swallowing or pain with eating. He says that his appetite has been "pretty good", but a knowledge is that he probably does not drink as much water as he should. No recent fever, chills, or night sweats.   The patient called EMS. He reports that the EMT checked his blood pressure laying down and then standing up. He reports that his blood pressure fell and his heart rate increased when he stood up. In the emergency department, he was initially tachycardic with a heart rate of 129 beats per minute, but it is now 89 beats per minute. His blood pressure is within normal limits. He is oxygenating 99%. His lab  data are significant for a hemoglobin of 8.6, BUN of 45, creatinine of 1.89, AST of 74, and ALT of 145. His urinalysis is unremarkable. CT scan of his head reveals a small scalp hematoma on the left and posterior parietal region and a posterior fossa arachnoid cyst, but no intracranial hemorrhage or infarct. CT scan of his neck reveals no acute fracture. He is being admitted for further evaluation and management.  Review of Systems: As above in history present illness, otherwise negative.  Past Medical History  Diagnosis Date  . Hypertension   . Diabetes mellitus without complication   . Pancreatic adenocarcinoma 2011    Status post resection and chemotherapy  . Depression   . Jaundice 2012    Secondary to occluded biliary stent.  Marland Kitchen GERD (gastroesophageal reflux disease)   . Anemia    Past Surgical History  Procedure Date  . Pancreatic resection 2011  . Ercp with biliary stent change 2012    Dr. Karilyn Cota   Social History: The patient is married. He lives in [place] with his wife and 44 year old daughter who has Type 1 diabetes mellitus. He is disabled from cancer. He denies alcohol, tobacco, and illicit drug use. He had been walking unassisted up until recently, but needs a walker to ambulate. He wants to be placed in an assisted living or nursing facility so he will not be a burden to his wife.  No Known Allergies  Family history: His mother died  of "stomach problems". His father died of a heart attack.  Prior to Admission medications   Medication Sig Start Date End Date Taking? Authorizing Provider  metFORMIN (GLUCOPHAGE) 1000 MG tablet Take 1,000 mg by mouth 2 (two) times daily.   Yes Historical Provider, MD  metoprolol succinate (TOPROL-XL) 25 MG 24 hr tablet Take 25 mg by mouth daily.   Yes Historical Provider, MD   Physical Exam: Filed Vitals:   10/20/12 1311 10/20/12 1454 10/20/12 1638 10/20/12 1702  BP: 129/71 130/81 124/76 128/66  Pulse: 129 120 90 89  Temp: 98.2  F (36.8 C)     Resp: 18 16 14 16   SpO2: 99% 100% 100%      General:  61 year old thin Caucasian man laying in bed, alert, in no acute distress.  Head: Soft hematoma on the left posterior parietal region, minimally tender.  Face: Temporal and generalized muscle atrophy.  Eyes: Pupils are equal, round, and reactive to light. Extraocular movements are intact. Conjunctivae are clear. Sclerae are white.  ENT: Nasal mucosa is dry. Oropharynx reveals no teeth. Mucosa membranes are dry. No posterior exudates or erythema.  Neck: Supple, no adenopathy, no thyromegaly, no JVD.  Cardiovascular: S1, S2, no murmurs rubs or gallops.  Respiratory: Clear to auscultation bilaterally.  Abdomen: Well-healed vertical scar. Bowel sounds are present. Mildly diffusely tender, but more tender at the left lower quadrant. Query stool burden. No distention. No hepatosplenomegaly.  Skin: Fair turgor. Mild erythema on both of his knees.  Musculoskeletal: Mild generalized muscle atrophy. Pedal pulses palpable. No pedal edema. Small fluid collection on the left knee, without warmth or significant erythema or tenderness. No pain with flexion and extension of his hips or knees.  Psychiatric: Flat and sometimes sad affect. Speech is clear. He is alert and oriented x3.  Neurologic: Cranial nerves II through XII are intact. No focal neurological findings. In the supine position, his global strength is 5 minus over 5. His speech is clear.  Labs on Admission:  Basic Metabolic Panel:  Lab 10/20/12 4098  NA 137  K 4.3  CL 104  CO2 25  GLUCOSE 139*  BUN 45*  CREATININE 1.89*  CALCIUM 9.3  MG --  PHOS --   Liver Function Tests:  Lab 10/20/12 1306  AST 74*  ALT 145*  ALKPHOS 56  BILITOT 0.3  PROT 6.9  ALBUMIN 3.8   No results found for this basename: LIPASE:5,AMYLASE:5 in the last 168 hours No results found for this basename: AMMONIA:5 in the last 168 hours CBC:  Lab 10/20/12 1306  WBC 5.1   NEUTROABS 4.1  HGB 8.6*  HCT 25.2*  MCV 91.6  PLT 169   Cardiac Enzymes:  Lab 10/20/12 1306  CKTOTAL --  CKMB --  CKMBINDEX --  TROPONINI <0.30    BNP (last 3 results) No results found for this basename: PROBNP:3 in the last 8760 hours CBG: No results found for this basename: GLUCAP:5 in the last 168 hours  Radiological Exams on Admission: Ct Head Wo Contrast  10/20/2012  *RADIOLOGY REPORT*  Clinical Data: *RADIOLOGY REPORT*  Clinical Data:  Loss of consciousness with trauma  CT HEAD WITHOUT CONTRAST CT CERVICAL SPINE WITHOUT CONTRAST  Technique:  Multidetector CT imaging of the head and cervical spine was performed following the standard protocol without intravenous contrast.  Multiplanar CT image reconstructions of the cervical spine were also generated.  Comparison:   None  CT HEAD  Findings:  Ventricles are normal in size and configuration. There is  an arachnoid cyst in the posterior fossa just to the left of midline posteriorly measuring 3.0 x 1.7 cm in size. There is no other evidence of mass.  There is no hemorrhage, extra-axial fluid collection, or midline shift.  There are no gray-white compartment lesions.  There is a small scalp hematoma in the left superior posterior parietal region.  Bony calvarium appears intact.  The mastoid air cells are clear.  IMPRESSION: Small scalp hematoma left superior and posterior parietal region. Posterior fossa arachnoid cyst.3 no intra-axial mass, hemorrhage, or evidence of focal infarct.  CT CERVICAL SPINE  Findings: There is no fracture or spondylolisthesis.  Prevertebral soft tissues and predental space regions are normal.  There is moderate disc space narrowing at C3-4. Other disc spaces appear intact.  There is calcification in the posterior longitudinal ligament at C3-4.  There is facet hypertrophy at several levels bilaterally.  There is no disc extrusion or stenosis.  IMPRESSION: Osteoarthritic change.  No fracture or spondylolisthesis.  No  erosive change or bony destruction.  CT HEAD WITHOUT CONTRAST  Technique:  Contiguous axial images were obtained from the base of the skull through the vertex without contrast.  Comparison: None.  Findings:  IMPRESSION:   Original Report Authenticated By: Bretta Bang, M.D.    Dg Chest Portable 1 View  10/20/2012  *RADIOLOGY REPORT*  Clinical Data: Loss of consciousness.  Pancreatic cancer.  PORTABLE CHEST - 1 VIEW  Comparison: CT chest and chest radiograph 01/06/2011.  Findings: Trachea is midline.  Heart size normal.  Lungs are clear. No pleural fluid.  IMPRESSION: No acute findings.   Original Report Authenticated By: Leanna Battles, M.D.     EKG: Sinus tachycardia with a heart rate of 123 beats per minute and bifascicular block.  Assessment/Plan Principal Problem:  *Syncope and collapse Active Problems:  Syncope due to orthostatic hypotension  Anemia  Acute renal failure  Hypovolemia  Constipation  Dysuria  Unintentional weight loss  Abdominal pain, generalized  Traumatic hematoma of scalp  Arachnoid cyst  Type 2 diabetes mellitus  Failure to thrive in adult  Elevated LFTs   1. This is a 61 year old man with a history significant for pancreatic cancer and diabetes mellitus, who presents with multiple episodes of syncope and collapse per his report. He has a small hematoma on his scalp which apparently happened several days ago following a fall. He appears dehydrated and hypovolemic. He has acute renal failure which is likely the result of prerenal azotemia. He has diabetes mellitus which is treated with metformin; he reports no persistently low blood sugars. It appears that his syncope is associated with orthostatic changes, but other etiologies will need to be entertained including cardiac arrhythmia/ischemia, recurrent malignancy, hypoglycemia, or underlying infection. His demeanor and overall affect appears to be one of depression and failure to thrive at home. Although he's  married, he states that "I am not able to look after myself". He does not want to be a burden to his wife who he apparently does not have a good relationship with. His unintentional weight loss is worrisome given his history of pancreatic cancer. His abdominal pain may be secondary to constipation which he has had a recent problem with. His liver transaminases are elevated. Pancreatic lipase was not assessed. He has no nausea vomiting. He is anemic which she has been in the past. His hemoglobin in March 2012 was 9.3 after being transfused for hemoglobin of 7.7.      Plan: 1. Start IV fluid hydration. 2.  Hold metformin and treat diabetes with sliding scale NovoLog. 3. Laxative therapy with Senokot S. and MiraLax. When necessary Dulcolax suppository. Add PPI. 4. Consult gastroenterologist, Dr. Karilyn Cota. 5. Consult physical therapy. 6. For further evaluation, we'll order orthostatic vital signs, TSH, free T4, hemoglobin A1c, carotid ultrasound, 2-D echocardiogram, cardiac enzymes, and CT scan of his abdomen and pelvis. We'll also order an anemia panel.    Code Status: Full code Family Communication: No family present. Disposition Plan: Disposition and length of stay to be determined.  Time spent: One hour and 10 minutes.  [facility] Pager [phone]  If 7PM-7AM, please contact night-coverage [email or web address] Password [email or web address] 10/20/2012, 5:53 PM

## 2012-10-21 ENCOUNTER — Encounter (HOSPITAL_COMMUNITY): Payer: Self-pay | Admitting: Internal Medicine

## 2012-10-21 ENCOUNTER — Inpatient Hospital Stay (HOSPITAL_COMMUNITY): Payer: Self-pay

## 2012-10-21 DIAGNOSIS — D649 Anemia, unspecified: Secondary | ICD-10-CM

## 2012-10-21 DIAGNOSIS — Z8509 Personal history of malignant neoplasm of other digestive organs: Secondary | ICD-10-CM

## 2012-10-21 DIAGNOSIS — E860 Dehydration: Secondary | ICD-10-CM

## 2012-10-21 DIAGNOSIS — R112 Nausea with vomiting, unspecified: Secondary | ICD-10-CM

## 2012-10-21 DIAGNOSIS — E43 Unspecified severe protein-calorie malnutrition: Secondary | ICD-10-CM | POA: Diagnosis present

## 2012-10-21 LAB — COMPREHENSIVE METABOLIC PANEL
ALT: 117 U/L — ABNORMAL HIGH (ref 0–53)
Alkaline Phosphatase: 47 U/L (ref 39–117)
BUN: 33 mg/dL — ABNORMAL HIGH (ref 6–23)
Chloride: 107 mEq/L (ref 96–112)
GFR calc Af Amer: 51 mL/min — ABNORMAL LOW (ref >90)
Glucose, Bld: 89 mg/dL (ref 70–99)
Potassium: 4.6 mEq/L (ref 3.5–5.1)
Sodium: 137 mEq/L (ref 135–145)
Total Bilirubin: 0.3 mg/dL (ref 0.3–1.2)

## 2012-10-21 LAB — CBC
MCHC: 34 g/dL (ref 30.0–36.0)
MCV: 91.4 fL (ref 78.0–100.0)
Platelets: 161 10*3/uL (ref 150–400)
RDW: 12.3 % (ref 11.5–15.5)
WBC: 3.3 10*3/uL — ABNORMAL LOW (ref 4.0–10.5)

## 2012-10-21 LAB — GLUCOSE, CAPILLARY
Glucose-Capillary: 86 mg/dL (ref 70–99)
Glucose-Capillary: 108 mg/dL — ABNORMAL HIGH (ref 70–99)
Glucose-Capillary: 104 mg/dL — ABNORMAL HIGH (ref 70–99)
Glucose-Capillary: 141 mg/dL — ABNORMAL HIGH (ref 70–99)

## 2012-10-21 LAB — IRON AND TIBC
Iron: 39 ug/dL — ABNORMAL LOW (ref 42–135)
TIBC: 261 ug/dL (ref 215–435)
UIBC: 222 ug/dL (ref 125–400)

## 2012-10-21 LAB — TSH: TSH: 1.478 u[IU]/mL (ref 0.350–4.500)

## 2012-10-21 LAB — FERRITIN: Ferritin: 620 ng/mL — ABNORMAL HIGH (ref 22–322)

## 2012-10-21 LAB — T4, FREE: Free T4: 1.27 ng/dL (ref 0.80–1.80)

## 2012-10-21 LAB — PREPARE RBC (CROSSMATCH)

## 2012-10-21 MED ORDER — TUBERCULIN PPD 5 UNIT/0.1ML ID SOLN
5.0000 [IU] | Freq: Once | INTRADERMAL | Status: DC
  Filled 2012-10-21: qty 0.1

## 2012-10-21 MED ORDER — TUBERCULIN PPD 5 UNIT/0.1ML ID SOLN
5.0000 [IU] | Freq: Once | INTRADERMAL | Status: AC
  Administered 2012-10-22: 5 [IU] via INTRADERMAL
  Filled 2012-10-21: qty 0.1

## 2012-10-21 MED ORDER — PANCRELIPASE (LIP-PROT-AMYL) 12000-38000 UNITS PO CPEP
2.0000 | ORAL_CAPSULE | Freq: Three times a day (TID) | ORAL | Status: DC
  Administered 2012-10-21 – 2012-10-25 (×13): 2 via ORAL
  Filled 2012-10-21 (×14): qty 2

## 2012-10-21 MED ORDER — MILK AND MOLASSES ENEMA
Freq: Once | RECTAL | Status: AC
  Administered 2012-10-21: 12:00:00 via RECTAL

## 2012-10-21 MED ORDER — GLUCERNA SHAKE PO LIQD
237.0000 mL | Freq: Three times a day (TID) | ORAL | Status: DC
  Administered 2012-10-21 – 2012-10-25 (×10): 237 mL via ORAL

## 2012-10-21 NOTE — Evaluation (Signed)
Physical Therapy Evaluation Patient Details Name: [name] MRN: [id number] DOB: 1951-07-11 Today's Date: 10/21/2012 Time: 8657-8469 PT Time Calculation (min): 46 min  PT Assessment / Plan / Recommendation Clinical Impression  Pt was found to be moderately deconditioned upon PT eval.  His strength is WFL in LEs but he tires easily when ambulating with a walker.  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  He is interested in placement and my recommendation would be to enter ACLF and receive HHPT.  We will follow to try to improve general mobility/endurance.    PT Assessment  Patient needs continued PT services    Follow Up Recommendations  Home health PT;Supervision/Assistance - 24 hour    Does the patient have the potential to tolerate intense rehabilitation      Barriers to Discharge Decreased caregiver support      Equipment Recommendations  None recommended by PT    Recommendations for Other Services OT consult   Frequency Min 3X/week    Precautions / Restrictions Precautions Precautions: None Restrictions Weight Bearing Restrictions: No   Pertinent Vitals/Pain       Mobility  Bed Mobility Bed Mobility: Supine to Sit;Sit to Supine Supine to Sit: 7: Independent Sit to Supine: 7: Independent Details for Bed Mobility Assistance: mild orthostasis...BP= 121/74 supine, 107/66 sitting Transfers Transfers: Sit to Stand;Stand to Sit Sit to Stand: 6: Modified independent (Device/Increase time);With upper extremity assist Stand to Sit: 6: Modified independent (Device/Increase time);With upper extremity assist Details for Transfer Assistance: BP standing =109/73 Ambulation/Gait Ambulation/Gait Assistance: 6: Modified independent (Device/Increase time) Ambulation Distance (Feet): 80 Feet Assistive device: Rolling walker Ambulation/Gait Assistance Details: pt had placed himself on a walker at  home about 2 weeks ago...this is appropriate given his general deconditioniong Gait Pattern: Within Functional Limits Gait velocity: WNL Stairs: No Wheelchair Mobility Wheelchair Mobility: No    Shoulder Instructions     Exercises     PT Diagnosis: Difficulty walking;Generalized weakness  PT Problem List: Decreased strength;Decreased activity tolerance;Decreased mobility;Cardiopulmonary status limiting activity;Impaired sensation PT Treatment Interventions: Gait training;Functional mobility training;Therapeutic activities;Therapeutic exercise;Patient/family education   PT Goals Acute Rehab PT Goals PT Goal Formulation: With patient Time For Goal Achievement: 10/28/12 Potential to Achieve Goals: Good Pt will Ambulate: >150 feet;with modified independence;with rolling walker PT Goal: Ambulate - Progress: Goal set today  Visit Information  Last PT Received On: 10/21/12 Assistance Needed: +1    Subjective Data  Subjective: I am having trouble taking care of myself at home Patient Stated Goal: none stated   Prior Functioning  Home Living Lives With: Spouse (marital discord...wife unavailable) Type of Home: House Home Access: Ramped entrance Home Layout: One level Bathroom Shower/Tub: Health visitor: Standard Home Adaptive Equipment: Shower chair with back;Walker - rolling Prior Function Level of Independence: Needs assistance Needs Assistance: Meal Prep;Light Housekeeping Able to Take Stairs?: No Driving: No Vocation: Unemployed Communication Communication: No difficulties    Cognition  Overall Cognitive Status: Appears within functional limits for tasks assessed/performed Arousal/Alertness: Awake/alert Orientation Level: Appears intact for tasks assessed Behavior During Session: [facility] for tasks performed    Extremity/Trunk Assessment Right Lower Extremity Assessment RLE ROM/Strength/Tone: WFL for tasks assessed RLE Sensation: History of peripheral  neuropathy RLE Coordination: WFL - gross motor Left Lower Extremity Assessment LLE ROM/Strength/Tone: WFL for tasks assessed LLE Sensation: History of peripheral neuropathy LLE Coordination: WFL - gross motor Trunk Assessment Trunk Assessment: Normal  Balance Balance Balance Assessed: No  End of Session PT - End of Session Equipment Utilized During Treatment: Gait belt Activity Tolerance: Patient tolerated treatment well (receiving blood) Patient left: in bed;with call bell/phone within reach;with nursing in room (pt declines sitting in chair) Nurse Communication: Mobility status  GP     [name] 10/21/2012, 3:30 PM

## 2012-10-21 NOTE — Progress Notes (Signed)
UR chart review completed.  

## 2012-10-21 NOTE — Evaluation (Signed)
Occupational Therapy Evaluation Patient Details Name: [name] MRN: [id number] DOB: 1951/04/05 Today's Date: 10/21/2012 Time: 4098-1191 OT Time Calculation (min): 16 min  OT Assessment / Plan / Recommendation Clinical Impression  Patient is a 61 y/o male s/p syncope and collapse presenting to acute OT with defcits below. Patient will benefit from OT services to increase core strength and Bil UE strength and endurance. Recommend Assisted Living facility at D/C.    OT Assessment  Patient needs continued OT Services    Follow Up Recommendations   (Assisted Living )    Barriers to Discharge Decreased caregiver support    Equipment Recommendations  None recommended by OT       Frequency  Min 2X/week    Precautions / Restrictions Precautions Precautions: None Restrictions Weight Bearing Restrictions: No   Pertinent Vitals/Pain 4/10 pain level in lower abdomen    ADL  Lower Body Dressing: Performed;Modified independent Where Assessed - Lower Body Dressing: Supported sit to stand    OT Diagnosis: Generalized weakness  OT Problem List: Decreased strength;Decreased activity tolerance OT Treatment Interventions: Self-care/ADL training;Therapeutic exercise;Energy conservation;Therapeutic activities;Balance training;DME and/or AE instruction   OT Goals Acute Rehab OT Goals OT Goal Formulation: With patient Time For Goal Achievement: 11/04/12 Potential to Achieve Goals: Good ADL Goals Pt Will Perform Grooming: with modified independence;Standing at sink ADL Goal: Grooming - Progress: Goal set today Pt Will Perform Upper Body Bathing: with modified independence;Sitting in shower ADL Goal: Upper Body Bathing - Progress: Goal set today Pt Will Perform Lower Body Bathing: with modified independence;Sitting in shower ADL Goal: Lower Body Bathing - Progress: Goal set today Pt Will Perform Upper Body Dressing: with modified independence;Sit to stand from bed ADL Goal: Upper Body  Dressing - Progress: Goal set today Pt Will Perform Lower Body Dressing: with modified independence;Sit to stand from bed ADL Goal: Lower Body Dressing - Progress: Goal set today Pt Will Transfer to Toilet: with modified independence;Regular height toilet;Ambulation ADL Goal: Toilet Transfer - Progress: Goal set today Pt Will Perform Toileting - Clothing Manipulation: with modified independence;Sitting on 3-in-1 or toilet Pt Will Perform Toileting - Hygiene: with modified independence;Leaning right and/or left on 3-in-1/toilet;Sit to stand from 3-in-1/toilet ADL Goal: Toileting - Hygiene - Progress: Goal set today Arm Goals Pt Will Complete Theraband Exer: with supervision, verbal cues required/provided;to increase strength;Bilateral upper extremities;2 sets;10 reps;Level 1 Theraband;Level 2 Theraband Arm Goal: Theraband Exercises - Progress: Goal set today Additional Arm Goal #1: Patient will complete Hand weight exercises with supervision to increase strength bilateral upper extremities 2 sets 10 reps 1-2lbs. Arm Goal: Additional Goal #1 - Progress: Goal set today Miscellaneous OT Goals Miscellaneous OT Goal #1: Patient will complete core strengthening exercises to increase functional performance during bathing and dressing tasks. OT Goal: Miscellaneous Goal #1 - Progress: Goal set today  Visit Information  Last OT Received On: 10/21/12 Assistance Needed: +1    Subjective Data  Subjective: "I am not able to take care of myself." Patient Stated Goal: To go somewhere where he won't be a burden to his wife.   Prior Functioning     Home Living Lives With: Spouse (marital discord...wife unavailable) Type of Home: House Home Access: Ramped entrance Home Layout: One level Bathroom Shower/Tub: Health visitor: Standard Home Adaptive Equipment: Shower chair with back;Walker - rolling Prior Function Level of Independence: Needs assistance Needs Assistance: Meal  Prep;Light Housekeeping Meal Prep: Total Light Housekeeping: Total Able to Take Stairs?: No Driving: No Vocation: Unemployed Communication Communication: No  difficulties Dominant Hand: Right            Cognition  Overall Cognitive Status: Appears within functional limits for tasks assessed/performed Arousal/Alertness: Awake/alert Orientation Level: Appears intact for tasks assessed Behavior During Session: [facility] for tasks performed    Extremity/Trunk Assessment Right Upper Extremity Assessment RUE ROM/Strength/Tone: Deficits RUE ROM/Strength/Tone Deficits: A/ROM WFL in all ranges. MMT: shoulder flexion, shoulder abduction, horizontal abd/adduction, elbow flexion, elbow extension: 3+/5. Gross grasp: impaired. RUE Sensation: History of peripheral neuropathy RUE Coordination: WFL - gross/fine motor Left Upper Extremity Assessment LUE ROM/Strength/Tone: Deficits LUE ROM/Strength/Tone Deficits: A/ROM WFL in all ranges. MMT: shoulder flexion, shoulder abduction, horizontal abd/adduction, elbow flexion, elbow extension: 3+/5. Gross grasp: impaired. LUE Sensation: History of peripheral neuropathy LUE Coordination: WFL - gross/fine motor     Mobility Bed Mobility Bed Mobility: Supine to Sit;Sit to Supine Supine to Sit: 7: Independent Sit to Supine: 7: Independent  Transfers Sit to Stand: 6: Modified independent (Device/Increase time);With upper extremity assist Stand to Sit: 6: Modified independent (Device/Increase time);With upper extremity assist           Balance Balance Balance Assessed: No   End of Session OT - End of Session Activity Tolerance: Patient tolerated treatment well Patient left: in bed;with call bell/phone within reach    [facility], OTR/L 10/21/2012, 3:53 PM

## 2012-10-21 NOTE — Progress Notes (Signed)
INITIAL ADULT NUTRITION ASSESSMENT Date: 10/21/2012   Time: 12:59 PM Reason for Assessment: MST=4  INTERVENTION: -Downgrade diet to dysphagia 2, with chopped meats -Glucerna Shake po TID, each supplement provides 220 kcal and 10 grams of protein.  Pt meets criteria for severe MALNUTRITION in the context of social and environmental cicumstances as evidenced by severe body fat loss and severe muscle loss.  ASSESSMENT: Male 61 y.o.  Dx: Syncope and collapse  Hx:  Past Medical History  Diagnosis Date  . Hypertension   . Diabetes mellitus without complication   . Pancreatic adenocarcinoma 2011    Status post resection and chemotherapy  . Depression   . Jaundice 2012    Secondary to occluded biliary stent.  Marland Kitchen GERD (gastroesophageal reflux disease)   . Anemia    Past Surgical History  Procedure Date  . Pancreatic resection 2011  . Ercp with biliary stent change 2012    Dr. Karilyn Cota   Related Meds:  Scheduled Meds:   . insulin aspart  0-5 Units Subcutaneous QHS  . insulin aspart  0-9 Units Subcutaneous TID WC  . lipase/protease/amylase  2 capsule Oral TID WC  . metoprolol succinate  25 mg Oral Daily  . [COMPLETED] milk and molasses   Rectal Once  . multivitamin with minerals  1 tablet Oral Daily  . pantoprazole  40 mg Oral Daily  . [COMPLETED] pneumococcal 23 valent vaccine  0.5 mL Intramuscular Tomorrow-1000  . polyethylene glycol  17 g Oral Daily  . senna  1 tablet Oral BID  . [COMPLETED] sodium chloride  1,000 mL Intravenous Once   Continuous Infusions:   . 0.9 % NaCl with KCl 20 mEq / L 125 mL/hr at 10/21/12 0510   PRN Meds:.acetaminophen, acetaminophen, albuterol, alum & mag hydroxide-simeth, bisacodyl, guaiFENesin-dextromethorphan, morphine injection, ondansetron (ZOFRAN) IV, ondansetron, oxyCODONE, traZODone  Ht: 6' (182.9 cm)  Wt: 149 lb 6.4 oz (67.767 kg)  Ideal Wt:  77.6 kg % Ideal Wt: 81%  Usual Wt: 200# % Usual Wt: 75%  Body mass index is 20.26  kg/(m^2). Classified as normal weight.   Food/Nutrition Related Hx: Pt reports a hx of progressive wt loss since April 2012, when he underwent pancreas sx. UBW: 200#. Pt reports "there's no fat on me" and feels like he "weighs no more than 130#". He reports a fair appetite. He has no teeth and is agreeable to a mechanically altered diet with chopped meats. He declines a pureed diet.  Pt with very poor social support. He reports that he has a poor relationship with his wife and daughter and has no other family in the area. It is hard for him to cook and shop for food due to weakness, falling, and neuropathy in his hands. His home diet consists mostly of frozen, microwaveable convenience foods. He does not drink nutrition supplements currently, but reports that he did drink Glucerna when he was going through chemotherapy. He desires to go to a nursing home for rehabilitation, as he feels he can no longer care for himself at home, but is very anxious about placement and inability to care for himself.  Nutrition Focused Physical Exam: Subcutaneous Fat:  Orbital Region: severe malnutrition (hollow, depressed) Upper Arm Region: severe malnutrition (very little space between folds) Thoracic and Lumbar Region: severe malnutrition  Muscle:  Temple Region: severe malnutrition (hollowing, scooping, depression) Clavicle Bone Region: severe malnutrition (protruding, prominent bone) Clavicle and Acromion Bone Region: severe malnutrition (protruding, prominent bone) Scapular Bone Region: severe malnutrition (bones prominent) Dorsal Hand:  severe malnutrition (dpressed area) Patellar Region: severe malnutrition (bones prominent) Anterior Thigh Region: severe malnutrition (mininal muscle definition) Posterior Calf Region: severe malnutrition (dpression)  Edema: none present   Labs:  CMP     Component Value Date/Time   NA 137 10/21/2012 0535   K 4.6 10/21/2012 0535   CL 107 10/21/2012 0535   CO2 21  10/21/2012 0535   GLUCOSE 89 10/21/2012 0535   BUN 33* 10/21/2012 0535   CREATININE 1.63* 10/21/2012 0535   CALCIUM 9.0 10/21/2012 0535   PROT 5.9* 10/21/2012 0535   ALBUMIN 3.3* 10/21/2012 0535   AST 55* 10/21/2012 0535   ALT 117* 10/21/2012 0535   ALKPHOS 47 10/21/2012 0535   BILITOT 0.3 10/21/2012 0535   GFRNONAA 44* 10/21/2012 0535   GFRAA 51* 10/21/2012 0535   Lab Results  Component Value Date   HGBA1C 5.5 10/20/2012   Lab Results  Component Value Date   CREATININE 1.63* 10/21/2012   Sodium  Date/Time Value Range Status  10/21/2012  5:35 AM 137  135 - 145 mEq/L Final  10/20/2012  1:06 PM 137  135 - 145 mEq/L Final  01/16/2011  5:22 AM 136  135 - 145 mEq/L Final    Potassium  Date/Time Value Range Status  10/21/2012  5:35 AM 4.6  3.5 - 5.1 mEq/L Final  10/20/2012  1:06 PM 4.3  3.5 - 5.1 mEq/L Final  01/16/2011  5:22 AM 3.2* 3.5 - 5.1 mEq/L Final    No results found for this basename: phos    Magnesium  Date/Time Value Range Status  01/15/2011  4:43 AM 1.5  1.5 - 2.5 mg/dL Final   Intake:   Intake/Output Summary (Last 24 hours) at 10/21/12 1335 Last data filed at 10/21/12 1254  Gross per 24 hour  Intake 1385.42 ml  Output    552 ml  Net 833.42 ml    Diet Order: Carb Control  Supplements/Tube Feeding: none at this time  IVF:    0.9 % NaCl with KCl 20 mEq / L Last Rate: 125 mL/hr at 10/21/12 0510    Estimated Nutritional Needs:   Kcal:2032-2370 kcals daily Protein:102-169 kcals daily Fluid:2.0-2.4 L fluid daily  NUTRITION DIAGNOSIS: -Inadequate oral intake (NI-2.1).  Status: Ongoing  RELATED TO: decreased PO intake, weight loss  AS EVIDENCE BY: signs of severe fat and muscle loss, progressive 50# wt loss x 1.5 years.   MONITORING/EVALUATION(Goals): 1) Pt will maintain current weight of 149# 2) Pt will meet >75% of estimated energy and protein needs  EDUCATION NEEDS: -Education needs addressed  Dietitian #: [phone]  DOCUMENTATION CODES Per approved  criteria  -Severe  malnutrition in the context of social or environmental circumstances    Melody Haver, RD, LDN 10/21/2012, 12:59 PM

## 2012-10-21 NOTE — Clinical Social Work Placement (Signed)
Clinical Social Work Department CLINICAL SOCIAL WORK PLACEMENT NOTE 10/21/2012  Patient:  [name], [name]  Account Number:  [id number] Admit date:  10/20/2012  Clinical Social Worker:  Derenda Fennel, LCSW  Date/time:  10/21/2012 04:00 PM  Clinical Social Work is seeking post-discharge placement for this patient at the following level of care:   ASSISTED LIVING/REST HOME   (*CSW will update this form in Epic as items are completed)   10/21/2012  Patient/family provided with [facility] Department of Clinical Social Work's list of facilities offering this level of care within the geographic area requested by the patient (or if unable, by the patient's family).  10/21/2012  Patient/family informed of their freedom to choose among providers that offer the needed level of care, that participate in Medicare, Medicaid or managed care program needed by the patient, have an available bed and are willing to accept the patient.  10/21/2012  Patient/family informed of [facility]' ownership interest in [facility], as well as of the fact that they are under no obligation to receive care at this facility.  PASARR submitted to EDS on  PASARR number received from EDS on   FL2 transmitted to all facilities in geographic area requested by pt/family on  10/21/2012 FL2 transmitted to all facilities within larger geographic area on   Patient informed that his/her managed care company has contracts with or will negotiate with  certain facilities, including the following:     Patient/family informed of bed offers received:   Patient chooses bed at  Physician recommends and patient chooses bed at    Patient to be transferred to  on   Patient to be transferred to facility by   The following physician request were entered in Epic:   Additional Comments: no pasarr needed as pt private pay.  Derenda Fennel, [place] [phone]

## 2012-10-21 NOTE — Consult Note (Signed)
Reason for Consult: anemia, n and V, dehydration Referring Physician:Fisher     [name] is an 61 y.o. male.  HPI:   Admitted last night thru the ED. He tells me over the past 2 weeks he has had multiple episodes of syncope.  He thinks maybe twenty syncopal episodes.  Noted in the ED to be orthostatic.  Syncopal episodes occur when he stands up. He also tells me he stays constipated all the time. He has been constipated for the past year. He denies any abdominal pain. His last BM was 4 days ago.  He tells me last night he tried to have a BM but only passed bright red blood.  He tells me off and on he has had black tarry stools for a couple of months. He has about 50 pounds over the past year, unintentional. He has a hx of pancreatitic cancer and is followed by Dr. [name] at [facility].  He has had chemo and radiation. Pancreatic surgery in April of 2012.  Noted on admission his H and H 8.6 and 25.2 He tells me his appetite is okay. He and his wife eat out frequently. He does not have a good relationship with his wife.  He denies fever, nausea or vomiting. No dysphagia.  Last colonoscopy was 2 yrs ago at [facility] and he reports that it was not complete.        Past Medical History  Diagnosis Date  . Hypertension   . Diabetes mellitus without complication   . Pancreatic adenocarcinoma 2011    Status post resection and chemotherapy  . Depression   . Jaundice 2012    Secondary to occluded biliary stent.  Marland Kitchen GERD (gastroesophageal reflux disease)   . Anemia     Past Surgical History  Procedure Date  . Pancreatic resection 2011  . Ercp with biliary stent change 2012    Dr. Karilyn Cota    History reviewed. No pertinent family history.  Social History:  reports that he has never smoked. He has never used smokeless tobacco. He reports that he does not drink alcohol or use illicit drugs.  Allergies: No Known Allergies  Medications: I have reviewed the patient's current  medications.  Results for orders placed during the hospital encounter of 10/20/12 (from the past 48 hour(s))  CBC WITH DIFFERENTIAL     Status: Abnormal   Collection Time   10/20/12  1:06 PM      Component Value Range Comment   WBC 5.1  4.0 - 10.5 K/uL    RBC 2.75 (*) 4.22 - 5.81 MIL/uL    Hemoglobin 8.6 (*) 13.0 - 17.0 g/dL    HCT 16.1 (*) 09.6 - 52.0 %    MCV 91.6  78.0 - 100.0 fL    MCH 31.3  26.0 - 34.0 pg    MCHC 34.1  30.0 - 36.0 g/dL    RDW 04.5  40.9 - 81.1 %    Platelets 169  150 - 400 K/uL    Neutrophils Relative 80 (*) 43 - 77 %    Neutro Abs 4.1  1.7 - 7.7 K/uL    Lymphocytes Relative 11 (*) 12 - 46 %    Lymphs Abs 0.6 (*) 0.7 - 4.0 K/uL    Monocytes Relative 8  3 - 12 %    Monocytes Absolute 0.4  0.1 - 1.0 K/uL    Eosinophils Relative 1  0 - 5 %    Eosinophils Absolute 0.0  0.0 - 0.7  K/uL    Basophils Relative 0  0 - 1 %    Basophils Absolute 0.0  0.0 - 0.1 K/uL   COMPREHENSIVE METABOLIC PANEL     Status: Abnormal   Collection Time   10/20/12  1:06 PM      Component Value Range Comment   Sodium 137  135 - 145 mEq/L    Potassium 4.3  3.5 - 5.1 mEq/L    Chloride 104  96 - 112 mEq/L    CO2 25  19 - 32 mEq/L    Glucose, Bld 139 (*) 70 - 99 mg/dL    BUN 45 (*) 6 - 23 mg/dL    Creatinine, Ser 1.61 (*) 0.50 - 1.35 mg/dL    Calcium 9.3  8.4 - 09.6 mg/dL    Total Protein 6.9  6.0 - 8.3 g/dL    Albumin 3.8  3.5 - 5.2 g/dL    AST 74 (*) 0 - 37 U/L    ALT 145 (*) 0 - 53 U/L    Alkaline Phosphatase 56  39 - 117 U/L    Total Bilirubin 0.3  0.3 - 1.2 mg/dL    GFR calc non Af Amer 37 (*) >90 mL/min    GFR calc Af Amer 43 (*) >90 mL/min   TROPONIN I     Status: Normal   Collection Time   10/20/12  1:06 PM      Component Value Range Comment   Troponin I <0.30  <0.30 ng/mL   URINALYSIS, ROUTINE W REFLEX MICROSCOPIC     Status: Normal   Collection Time   10/20/12  4:50 PM      Component Value Range Comment   Color, Urine YELLOW  YELLOW    APPearance CLEAR  CLEAR     Specific Gravity, Urine 1.015  1.005 - 1.030    pH 6.0  5.0 - 8.0    Glucose, UA NEGATIVE  NEGATIVE mg/dL    Hgb urine dipstick NEGATIVE  NEGATIVE    Bilirubin Urine NEGATIVE  NEGATIVE    Ketones, ur NEGATIVE  NEGATIVE mg/dL    Protein, ur NEGATIVE  NEGATIVE mg/dL    Urobilinogen, UA 0.2  0.0 - 1.0 mg/dL    Nitrite NEGATIVE  NEGATIVE    Leukocytes, UA NEGATIVE  NEGATIVE MICROSCOPIC NOT DONE ON URINES WITH NEGATIVE PROTEIN, BLOOD, LEUKOCYTES, NITRITE, OR GLUCOSE <1000 mg/dL.  TSH     Status: Normal   Collection Time   10/20/12  6:57 PM      Component Value Range Comment   TSH 1.478  0.350 - 4.500 uIU/mL   TROPONIN I     Status: Normal   Collection Time   10/20/12  6:57 PM      Component Value Range Comment   Troponin I <0.30  <0.30 ng/mL   HEMOGLOBIN A1C     Status: Normal   Collection Time   10/20/12  6:57 PM      Component Value Range Comment   Hemoglobin A1C 5.5  <5.7 %    Mean Plasma Glucose 111  <117 mg/dL   VITAMIN E45     Status: Abnormal   Collection Time   10/20/12  6:57 PM      Component Value Range Comment   Vitamin B-12 1009 (*) 211 - 911 pg/mL   FOLATE     Status: Normal   Collection Time   10/20/12  6:57 PM      Component Value Range Comment   Folate 18.0  IRON AND TIBC     Status: Abnormal   Collection Time   10/20/12  6:57 PM      Component Value Range Comment   Iron 39 (*) 42 - 135 ug/dL    TIBC 161  096 - 045 ug/dL    Saturation Ratios 15 (*) 20 - 55 %    UIBC 222  125 - 400 ug/dL   FERRITIN     Status: Abnormal   Collection Time   10/20/12  6:57 PM      Component Value Range Comment   Ferritin 620 (*) 22 - 322 ng/mL   RETICULOCYTES     Status: Abnormal   Collection Time   10/20/12  6:57 PM      Component Value Range Comment   Retic Ct Pct 0.7  0.4 - 3.1 %    RBC. 2.65 (*) 4.22 - 5.81 MIL/uL    Retic Count, Manual 18.6 (*) 19.0 - 186.0 K/uL   T4, FREE     Status: Normal   Collection Time   10/20/12  6:57 PM      Component Value Range Comment    Free T4 1.27  0.80 - 1.80 ng/dL   LIPASE, BLOOD     Status: Abnormal   Collection Time   10/20/12  6:58 PM      Component Value Range Comment   Lipase 6 (*) 11 - 59 U/L   CK     Status: Normal   Collection Time   10/20/12  6:58 PM      Component Value Range Comment   Total CK 108  7 - 232 U/L   GLUCOSE, CAPILLARY     Status: Normal   Collection Time   10/20/12  9:23 PM      Component Value Range Comment   Glucose-Capillary 88  70 - 99 mg/dL    Comment 1 Notify RN      Comment 2 Documented in Chart     TROPONIN I     Status: Normal   Collection Time   10/20/12 11:51 PM      Component Value Range Comment   Troponin I <0.30  <0.30 ng/mL   COMPREHENSIVE METABOLIC PANEL     Status: Abnormal   Collection Time   10/21/12  5:35 AM      Component Value Range Comment   Sodium 137  135 - 145 mEq/L    Potassium 4.6  3.5 - 5.1 mEq/L    Chloride 107  96 - 112 mEq/L    CO2 21  19 - 32 mEq/L    Glucose, Bld 89  70 - 99 mg/dL    BUN 33 (*) 6 - 23 mg/dL    Creatinine, Ser 4.09 (*) 0.50 - 1.35 mg/dL    Calcium 9.0  8.4 - 81.1 mg/dL    Total Protein 5.9 (*) 6.0 - 8.3 g/dL    Albumin 3.3 (*) 3.5 - 5.2 g/dL    AST 55 (*) 0 - 37 U/L    ALT 117 (*) 0 - 53 U/L    Alkaline Phosphatase 47  39 - 117 U/L    Total Bilirubin 0.3  0.3 - 1.2 mg/dL    GFR calc non Af Amer 44 (*) >90 mL/min    GFR calc Af Amer 51 (*) >90 mL/min   CBC     Status: Abnormal   Collection Time   10/21/12  5:35 AM      Component Value Range Comment  WBC 3.3 (*) 4.0 - 10.5 K/uL    RBC 2.32 (*) 4.22 - 5.81 MIL/uL    Hemoglobin 7.2 (*) 13.0 - 17.0 g/dL    HCT 16.1 (*) 09.6 - 52.0 %    MCV 91.4  78.0 - 100.0 fL    MCH 31.0  26.0 - 34.0 pg    MCHC 34.0  30.0 - 36.0 g/dL    RDW 04.5  40.9 - 81.1 %    Platelets 161  150 - 400 K/uL   LIPASE, BLOOD     Status: Abnormal   Collection Time   10/21/12  5:35 AM      Component Value Range Comment   Lipase 6 (*) 11 - 59 U/L   TROPONIN I     Status: Normal   Collection Time   10/21/12   5:39 AM      Component Value Range Comment   Troponin I <0.30  <0.30 ng/mL   GLUCOSE, CAPILLARY     Status: Normal   Collection Time   10/21/12  7:40 AM      Component Value Range Comment   Glucose-Capillary 86  70 - 99 mg/dL    Comment 1 Notify RN      Comment 2 Documented in Chart       Ct Abdomen Pelvis Wo Contrast  10/20/2012  *RADIOLOGY REPORT*  Clinical Data: acute renal failure, history of pancreatic resection, diabetes, dysuria  CT ABDOMEN AND PELVIS WITHOUT CONTRAST  Technique:  Multidetector CT imaging of the abdomen and pelvis was performed following the standard protocol without intravenous contrast.  Comparison: 01/06/2011  Findings: Sagittal images of the spine shows no destructive bony lesions.  Disc space flattening with vacuum disc phenomenon noted at L5 S1 level.  Lung bases are unremarkable.  Study is limited without IV contrast.  There is mild pneumobilia in the left hepatic lobe.  No intrahepatic biliary ductal dilatation.  Oral contrast material was given to the patient.  The visualized unenhanced pancreas is unremarkable.  Unenhanced spleen and adrenal glands are unremarkable.  Atherosclerotic calcifications of the abdominal aorta and the iliac arteries are noted.  Atherosclerotic calcifications of the SMA.  Unenhanced kidneys are symmetrical in size.  Nonspecific mild bilateral perinephric stranding.  At least three or four punctate nonobstructive calcifications are noted within the right kidney the largest in lower pole measures 2.2 mm.  At least three nonobstructive calcified calculi are noted within the left kidney the largest in mid pole measures 2.6 mm.  No hydronephrosis or hydroureter.  No calcified ureteral calculi are noted bilaterally.  No aortic aneurysm.  No small bowel obstruction.  No ascites or free air.  No adenopathy.  There is significant stool noted in the sigmoid colon.  No pericecal inflammation is noted.  The terminal ileum is unremarkable.  A distended  urinary bladder is noted. Significant stool noted within the rectum.  The rectum measures at least 7.2 cm in diameter highly suspicious for fecal impaction.  Bilateral distal ureter is unremarkable.  No calcified calculi are noted within urinary bladder.  A metallic surgical clip is noted in the right upper scrotum. Prostate gland is unremarkable.  There is mild subcutaneous edema bilateral gluteal region.  IMPRESSION:  1.  The study is limited without IV contrast.  Mild pneumobilia is noted in the left hepatic lobe. 2.  Atherosclerotic calcifications of the abdominal aorta and the iliac arteries. 3.  There is bilateral nonobstructive nephrolithiasis.  No hydronephrosis or hydroureter.  No calcified  ureteral calculi are noted bilaterally. 4.  No small bowel obstruction. 5.  Significant stool noted within sigmoid colon and rectum.  The rectum is distended with stool measures at least 7.2 cm in diameter highly suspicious for distal fecal impaction. 6.  There is a distended urinary bladder.   Original Report Authenticated By: Natasha Mead, M.D.    Ct Head Wo Contrast  10/20/2012  *RADIOLOGY REPORT*  Clinical Data: *RADIOLOGY REPORT*  Clinical Data:  Loss of consciousness with trauma  CT HEAD WITHOUT CONTRAST CT CERVICAL SPINE WITHOUT CONTRAST  Technique:  Multidetector CT imaging of the head and cervical spine was performed following the standard protocol without intravenous contrast.  Multiplanar CT image reconstructions of the cervical spine were also generated.  Comparison:   None  CT HEAD  Findings:  Ventricles are normal in size and configuration. There is an arachnoid cyst in the posterior fossa just to the left of midline posteriorly measuring 3.0 x 1.7 cm in size. There is no other evidence of mass.  There is no hemorrhage, extra-axial fluid collection, or midline shift.  There are no gray-white compartment lesions.  There is a small scalp hematoma in the left superior posterior parietal region.  Bony calvarium  appears intact.  The mastoid air cells are clear.  IMPRESSION: Small scalp hematoma left superior and posterior parietal region. Posterior fossa arachnoid cyst.3 no intra-axial mass, hemorrhage, or evidence of focal infarct.  CT CERVICAL SPINE  Findings: There is no fracture or spondylolisthesis.  Prevertebral soft tissues and predental space regions are normal.  There is moderate disc space narrowing at C3-4. Other disc spaces appear intact.  There is calcification in the posterior longitudinal ligament at C3-4.  There is facet hypertrophy at several levels bilaterally.  There is no disc extrusion or stenosis.  IMPRESSION: Osteoarthritic change.  No fracture or spondylolisthesis.  No erosive change or bony destruction.  CT HEAD WITHOUT CONTRAST  Technique:  Contiguous axial images were obtained from the base of the skull through the vertex without contrast.  Comparison: None.  Findings:  IMPRESSION:   Original Report Authenticated By: Bretta Bang, M.D.    Dg Chest Portable 1 View  10/20/2012  *RADIOLOGY REPORT*  Clinical Data: Loss of consciousness.  Pancreatic cancer.  PORTABLE CHEST - 1 VIEW  Comparison: CT chest and chest radiograph 01/06/2011.  Findings: Trachea is midline.  Heart size normal.  Lungs are clear. No pleural fluid.  IMPRESSION: No acute findings.   Original Report Authenticated By: Leanna Battles, M.D.     Review of Systems  Psychiatric/Behavioral: Positive for suicidal ideas.   Blood pressure 87/56, pulse 104, temperature 98.5 F (36.9 C), temperature source Oral, resp. rate 20, height 6' (1.829 m), weight 149 lb 6.4 oz (67.767 kg), SpO2 96.00%. Physical ExamAlert and oriented. Skin warm and dry. Oral mucosa is dry.  Sclera anicteric, conjunctivae is pink. Thyroid not enlarged. No cervical lymphadenopathy. Lungs clear. Heart regular rate and rhythm.  Abdomen is soft. Bowel sounds are positive. No hepatomegaly. No abdominal masses felt. No tenderness.  No edema to lower  extremities. Stool brown and guaiac negative.   Assessment/Plan: Hypovolemia/Dehydration: Agree with IV fluids. Last colonoscopy 2 yrs ago at [facility] was normal but incomplete. Stool was guaiac negative by me today and brown in color.   SETZER,TERRI W 10/21/2012, 8:38 AM     GI attending note; Patient interviewed and examined. Lab studies and CT abdomen and pelvis reviewed. Patient is well known to me from previous evaluation. He was  diagnosed with pancreatic adenocarcinoma in December 2011 19 presented with painless jaundice and weighed 193 pounds. He had chemoradiation preoperatively. His stent was changed in February 2012 and he had Whipple procedure in April 2012. He now presents with anorexia continued weight loss multiple episodes of postero-syncope and constipation. He also appears to be depressed. There also appear to be home issues. Lab studies pertinent for anemia, elevated BUN, creatinine as well as transaminases. Abdominopelvic CT does not show lymphadenopathy or liver lesions. There appears to be narrowing at gastrojejunal anastomosis due to edema. He also has pneumobilia. Abdominal exam reveals mild to moderate midepigastric tenderness and mild tenderness at RLQ. No organomegaly or masses noted. His stool is guaiac negative as noted  Above by Ms. Dorene Ar NP. His problems can be summarized as below; #1. Orthostatic hypotension and syncope. Suspect it is multifactorial secondary to dehydration and diabetic neuropathy. #2. Anemia. His stool is guaiac negative. There is no history of melena or rectal bleeding. B12 and folate levels are normal. Iron studies suggest anemia of chronic disease. #3. Elevated transaminases. Suspect transaminitis secondary to hypotension or fatty liver. Pneumobilia secondary to hepaticojejunostomy or choledochojejunostomy. Will need to monitor trend. #4. Weight loss may be related to depression and poor intake. TSH is normal. Given orthostasis  need to rule out Addison's disease. #5. History of pancreatic adenocarcinoma. Status post Whipple procedure in April 2012 and no evidence of recurrence on CT. #6. Diabetes mellitus. Hemoglobin A1c is 5.5. #7. Constipation may be secondary to order oral intake, diminished physical activity and colonic dysmotility.       He reports having had colonoscopy prior to pancreatic surgery. Will request these records. Recommendations; Check sedimentation rate fasting cortisol level and CA 19-9. Request records from [facility]. May consider EGD prior to discharge.

## 2012-10-21 NOTE — Progress Notes (Signed)
*  PRELIMINARY RESULTS* Echocardiogram 2D Echocardiogram has been performed.  Nestor Ramp M 10/21/2012, 2:25 PM

## 2012-10-21 NOTE — Progress Notes (Signed)
Subjective: Patient has no complaints of chest pain, shortness of breath, or abdominal pain. He wonders why his blood pressure falls to much when he stands up.  Objective: Vital signs in last 24 hours: Filed Vitals:   10/21/12 0439 10/21/12 0442 10/21/12 1051 10/21/12 1345  BP: 117/76 87/56 125/70 109/70  Pulse: 89 104 79 87  Temp:    98.4 F (36.9 C)  TempSrc:    Oral  Resp: 20 20  18   Height:      Weight:  67.767 kg (149 lb 6.4 oz)    SpO2: 97% 96% 99% 98%    Intake/Output Summary (Last 24 hours) at 10/21/12 1409 Last data filed at 10/21/12 1254  Gross per 24 hour  Intake 1385.42 ml  Output    552 ml  Net 833.42 ml    Weight change:   Physical exam: General: 61 year old thin Caucasian man laying in bed, in no acute distress. Head: Stable soft hematoma on the posterior parietal region. Lungs: Clear to auscultation bilaterally. Heart: S1, S2, with no murmurs rubs or gallops. Abdomen: Well-healed vertical scar, soft, mildly tender at the left lower quadrant, query stool palpated at the left lower quadrant, no distention. Extremities: No pedal edema. Small fluid collection on the left knee, nontender. Neurologic/psychiatric: Very flat affect. Alert and oriented x3.  Lab Results: Basic Metabolic Panel:  Basename 10/21/12 0535 10/20/12 1306  NA 137 137  K 4.6 4.3  CL 107 104  CO2 21 25  GLUCOSE 89 139*  BUN 33* 45*  CREATININE 1.63* 1.89*  CALCIUM 9.0 9.3  MG -- --  PHOS -- --   Liver Function Tests:  [facility] 10/21/12 0535 10/20/12 1306  AST 55* 74*  ALT 117* 145*  ALKPHOS 47 56  BILITOT 0.3 0.3  PROT 5.9* 6.9  ALBUMIN 3.3* 3.8    Basename 10/21/12 0535 10/20/12 1858  LIPASE 6* 6*  AMYLASE -- --   No results found for this basename: AMMONIA:2 in the last 72 hours CBC:  Basename 10/21/12 0535 10/20/12 1306  WBC 3.3* 5.1  NEUTROABS -- 4.1  HGB 7.2* 8.6*  HCT 21.2* 25.2*  MCV 91.4 91.6  PLT 161 169   Cardiac Enzymes:  Basename 10/21/12 0539  10/20/12 2351 10/20/12 1858 10/20/12 1857  CKTOTAL -- -- 108 --  CKMB -- -- -- --  CKMBINDEX -- -- -- --  TROPONINI <0.30 <0.30 -- <0.30   BNP: No results found for this basename: PROBNP:3 in the last 72 hours D-Dimer: No results found for this basename: DDIMER:2 in the last 72 hours CBG:  Basename 10/21/12 1111 10/21/12 0740 10/20/12 2123  GLUCAP 108* 86 88   Hemoglobin A1C:  Basename 10/20/12 1857  HGBA1C 5.5   Fasting Lipid Panel: No results found for this basename: CHOL,HDL,LDLCALC,TRIG,CHOLHDL,LDLDIRECT in the last 72 hours Thyroid Function Tests:  Basename 10/20/12 1857  TSH 1.478  T4TOTAL --  FREET4 1.27  T3FREE --  THYROIDAB --   Anemia Panel:  Basename 10/20/12 1857  VITAMINB12 1009*  FOLATE 18.0  FERRITIN 620*  TIBC 261  IRON 39*  RETICCTPCT 0.7   Coagulation: No results found for this basename: LABPROT:2,INR:2 in the last 72 hours Urine Drug Screen: Drugs of Abuse  No results found for this basename: labopia, cocainscrnur, labbenz, amphetmu, thcu, labbarb    Alcohol Level: No results found for this basename: ETH:2 in the last 72 hours Urinalysis:  Basename 10/20/12 1650  COLORURINE YELLOW  LABSPEC 1.015  PHURINE 6.0  GLUCOSEU NEGATIVE  HGBUR NEGATIVE  BILIRUBINUR NEGATIVE  KETONESUR NEGATIVE  PROTEINUR NEGATIVE  UROBILINOGEN 0.2  NITRITE NEGATIVE  LEUKOCYTESUR NEGATIVE   Misc. Labs:   Micro: No results found for this or any previous visit (from the past 240 hour(s)).  Studies/Results: Ct Abdomen Pelvis Wo Contrast  10/20/2012  *RADIOLOGY REPORT*  Clinical Data: acute renal failure, history of pancreatic resection, diabetes, dysuria  CT ABDOMEN AND PELVIS WITHOUT CONTRAST  Technique:  Multidetector CT imaging of the abdomen and pelvis was performed following the standard protocol without intravenous contrast.  Comparison: 01/06/2011  Findings: Sagittal images of the spine shows no destructive bony lesions.  Disc space flattening with  vacuum disc phenomenon noted at L5 S1 level.  Lung bases are unremarkable.  Study is limited without IV contrast.  There is mild pneumobilia in the left hepatic lobe.  No intrahepatic biliary ductal dilatation.  Oral contrast material was given to the patient.  The visualized unenhanced pancreas is unremarkable.  Unenhanced spleen and adrenal glands are unremarkable.  Atherosclerotic calcifications of the abdominal aorta and the iliac arteries are noted.  Atherosclerotic calcifications of the SMA.  Unenhanced kidneys are symmetrical in size.  Nonspecific mild bilateral perinephric stranding.  At least three or four punctate nonobstructive calcifications are noted within the right kidney the largest in lower pole measures 2.2 mm.  At least three nonobstructive calcified calculi are noted within the left kidney the largest in mid pole measures 2.6 mm.  No hydronephrosis or hydroureter.  No calcified ureteral calculi are noted bilaterally.  No aortic aneurysm.  No small bowel obstruction.  No ascites or free air.  No adenopathy.  There is significant stool noted in the sigmoid colon.  No pericecal inflammation is noted.  The terminal ileum is unremarkable.  A distended urinary bladder is noted. Significant stool noted within the rectum.  The rectum measures at least 7.2 cm in diameter highly suspicious for fecal impaction.  Bilateral distal ureter is unremarkable.  No calcified calculi are noted within urinary bladder.  A metallic surgical clip is noted in the right upper scrotum. Prostate gland is unremarkable.  There is mild subcutaneous edema bilateral gluteal region.  IMPRESSION:  1.  The study is limited without IV contrast.  Mild pneumobilia is noted in the left hepatic lobe. 2.  Atherosclerotic calcifications of the abdominal aorta and the iliac arteries. 3.  There is bilateral nonobstructive nephrolithiasis.  No hydronephrosis or hydroureter.  No calcified ureteral calculi are noted bilaterally. 4.  No small  bowel obstruction. 5.  Significant stool noted within sigmoid colon and rectum.  The rectum is distended with stool measures at least 7.2 cm in diameter highly suspicious for distal fecal impaction. 6.  There is a distended urinary bladder.   Original Report Authenticated By: Natasha Mead, M.D.    Ct Head Wo Contrast  10/20/2012  *RADIOLOGY REPORT*  Clinical Data: *RADIOLOGY REPORT*  Clinical Data:  Loss of consciousness with trauma  CT HEAD WITHOUT CONTRAST CT CERVICAL SPINE WITHOUT CONTRAST  Technique:  Multidetector CT imaging of the head and cervical spine was performed following the standard protocol without intravenous contrast.  Multiplanar CT image reconstructions of the cervical spine were also generated.  Comparison:   None  CT HEAD  Findings:  Ventricles are normal in size and configuration. There is an arachnoid cyst in the posterior fossa just to the left of midline posteriorly measuring 3.0 x 1.7 cm in size. There is no other evidence of mass.  There is no hemorrhage,  extra-axial fluid collection, or midline shift.  There are no gray-white compartment lesions.  There is a small scalp hematoma in the left superior posterior parietal region.  Bony calvarium appears intact.  The mastoid air cells are clear.  IMPRESSION: Small scalp hematoma left superior and posterior parietal region. Posterior fossa arachnoid cyst.3 no intra-axial mass, hemorrhage, or evidence of focal infarct.  CT CERVICAL SPINE  Findings: There is no fracture or spondylolisthesis.  Prevertebral soft tissues and predental space regions are normal.  There is moderate disc space narrowing at C3-4. Other disc spaces appear intact.  There is calcification in the posterior longitudinal ligament at C3-4.  There is facet hypertrophy at several levels bilaterally.  There is no disc extrusion or stenosis.  IMPRESSION: Osteoarthritic change.  No fracture or spondylolisthesis.  No erosive change or bony destruction.  CT HEAD WITHOUT CONTRAST   Technique:  Contiguous axial images were obtained from the base of the skull through the vertex without contrast.  Comparison: None.  Findings:  IMPRESSION:   Original Report Authenticated By: Bretta Bang, M.D.    US Carotid Duplex Bilateral  10/21/2012  *RADIOLOGY REPORT*  Clinical Data: Syncope and blurred vision.  History of hypertension and diabetes.  BILATERAL CAROTID DUPLEX ULTRASOUND  Technique: Wallace Cullens scale imaging, color Doppler and duplex ultrasound was performed of bilateral carotid and vertebral arteries in the neck.  Comparison:  None.  Criteria:  Quantification of carotid stenosis is based on velocity parameters that correlate the residual internal carotid diameter with NASCET-based stenosis levels, using the diameter of the distal internal carotid lumen as the denominator for stenosis measurement.  The following velocity measurements were obtained:                   PEAK SYSTOLIC/END DIASTOLIC RIGHT ICA:                        119/36cm/sec CCA:                        92/12cm/sec SYSTOLIC ICA/CCA RATIO:     1.3 DIASTOLIC ICA/CCA RATIO:    2.9 ECA:                        146cm/sec  LEFT ICA:                        117/27cm/sec CCA:                        120/18cm/sec SYSTOLIC ICA/CCA RATIO:     1.0 DIASTOLIC ICA/CCA RATIO:    1.5 ECA:                        111cm/sec  Findings:  RIGHT CAROTID ARTERY: A mild amount of partially calcified plaque is present eccentrically at the level of the distal bulb and extending into the proximal internal carotid artery.  Estimated ICA stenosis is less than 50%.  RIGHT VERTEBRAL ARTERY:  Antegrade flow with normal wave form.  LEFT CAROTID ARTERY: Similar and slightly less prominent partially calcified plaque is present at the level of the carotid bulb and proximal ICA on the left.  Estimated ICA stenosis is less than 50%.  LEFT VERTEBRAL ARTERY:  Antegrade flow with normal wave form.  IMPRESSION: Relatively mild amount of bilateral atherosclerotic plaque, right  greater than left.  Estimated  bilateral ICA stenoses are less than 50%.   Original Report Authenticated By: Irish Lack, M.D.    Dg Chest Portable 1 View  10/20/2012  *RADIOLOGY REPORT*  Clinical Data: Loss of consciousness.  Pancreatic cancer.  PORTABLE CHEST - 1 VIEW  Comparison: CT chest and chest radiograph 01/06/2011.  Findings: Trachea is midline.  Heart size normal.  Lungs are clear. No pleural fluid.  IMPRESSION: No acute findings.   Original Report Authenticated By: Leanna Battles, M.D.     Medications:  Scheduled:   . feeding supplement  237 mL Oral TID BM  . insulin aspart  0-5 Units Subcutaneous QHS  . insulin aspart  0-9 Units Subcutaneous TID WC  . lipase/protease/amylase  2 capsule Oral TID WC  . metoprolol succinate  25 mg Oral Daily  . [COMPLETED] milk and molasses   Rectal Once  . multivitamin with minerals  1 tablet Oral Daily  . pantoprazole  40 mg Oral Daily  . [COMPLETED] pneumococcal 23 valent vaccine  0.5 mL Intramuscular Tomorrow-1000  . polyethylene glycol  17 g Oral Daily  . senna  1 tablet Oral BID  . [COMPLETED] sodium chloride  1,000 mL Intravenous Once   Continuous:   . 0.9 % NaCl with KCl 20 mEq / L 125 mL/hr at 10/21/12 0510   ZOX:[id number], acetaminophen, albuterol, alum & mag hydroxide-simeth, bisacodyl, guaiFENesin-dextromethorphan, morphine injection, ondansetron (ZOFRAN) IV, ondansetron, oxyCODONE, traZODone  Assessment: Principal Problem:  *Syncope and collapse Active Problems:  Syncope due to orthostatic hypotension  Anemia  Acute renal failure  Hypovolemia  Constipation  Dysuria  Unintentional weight loss  Abdominal pain, generalized  Traumatic hematoma of scalp  Arachnoid cyst  Type 2 diabetes mellitus  Failure to thrive in adult  Elevated LFTs  Abnormal EKG   1. Syncope and collapse. Multiple episodes per history. His syncope is likely secondary to orthostatic hypotension. Orthostatic vital signs noted for hypotension  upon standing. He may require a pharmacological agent to treat this if IV fluid hydration does not resolve it. CT scan of his head revealed no acute intracranial findings. Carotid ultrasound reveals no significant ICA stenosis bilaterally. 2-D echocardiogram pending.  Hypovolemia. Treating with IV fluids.  Acute renal failure. His creatinine is improving with IV fluid hydration.  Anemia. His hemoglobin was 8.6 on admission. It has fallen to 7.2. This is likely in part dilutional. Nevertheless, I believe he needs further investigation. His anemia panel is noted for mild iron deficiency. GI has been consulted. His stool is guaiac negative per GI PA., Ms. Setzer.  Elevated LFTs. CT scan of his abdomen was ordered for evaluation of abdominal pain, constipation, and in the setting of elevated LFTs and history of pancreatic cancer. The findings were significant for fecal impaction in the rectum and mild pneumobilia in the left hepatic lobe. We'll ask GI to address.  Fecal impaction/constipation. As above. Milk and molasses enema was ordered. MiraLax and Senokot S. have been started. Further evaluation and recommendations per GI. Of note, his stool was guaiac negative per GI PA, Ms. Valetta Fuller.  Unintentional weight loss. The patient has a history of pancreatic cancer and pancreatic insufficiency. His lipase is actually low normal. He requires Creon with his meals. His weight loss may be a manifestation of depression, poor oral intake, and poor relationship with his wife. Apparently she does not cook but orders out quite a lot. His thyroid function is within normal limits.  Type 2 diabetes mellitus. Metformin is on hold. He is being treated with  sliding scale NovoLog. His hemoglobin A1c is 5.5.  Severe malnutrition. Registered dietitian assessment and recommendations noted and appreciated.   Deconditioning and overall failure to thrive. The patient desires short-term skilled nursing facility placement if he  cannot afford assisted living facility long-term. Apparently, he does not have a good relationship with his wife. He does not want to be a burden to her according to him.   Plan:  1. Transfuse 2 units of packed red blood cells. 2. Continue to check orthostatic vital signs. If he remains orthostatic tomorrow, consider starting Midodrine. 3. Milk of molasses enema and continued laxative therapy. 4. Await Dr. Patty Sermons assessment and recommendation. 5. PT consultation pending.    LOS: 1 day   Oneisha Ammons 10/21/2012, 2:09 PM

## 2012-10-21 NOTE — Clinical Social Work Psychosocial (Signed)
Clinical Social Work Department BRIEF PSYCHOSOCIAL ASSESSMENT 10/21/2012  Patient:  [name], [name]     Account Number:  [id number]     Admit date:  10/20/2012  Clinical Social Worker:  Nancie Neas  Date/Time:  10/21/2012 04:00 PM  Referred by:  Physician  Date Referred:  10/21/2012 Referred for  ALF Placement   Other Referral:   Interview type:  Patient Other interview type:    PSYCHOSOCIAL DATA Living Status:  WIFE Admitted from facility:   Level of care:   Primary support name:  Crystal Primary support relationship to patient:  CHILD, MINOR Degree of support available:   daughter is 42    CURRENT CONCERNS Current Concerns  Post-Acute Placement   Other Concerns:    SOCIAL WORK ASSESSMENT / PLAN CSW met with pt at bedside following referral for MD. Pt alert and oriented and reports he lives with his wife and daughter but is currently separated from his wife. He would like to pursue ALF placement which was recommended by PT and OT. Pt has Medicaid application initiated but has not produced any bank statements. Per Wyatt Haste, pt's Medicaid caseworker at [facility] DSS, pt would be private pay until application complete. This was explained to pt and he is willing to pay privately. ALF list for [facility] provided as this is preferred location. Pt indicates he would like to be near his daughter so she can visit. TB skin test requested. FL2 faxed out and CSW to follow up with bed offers.   Assessment/plan status:  Psychosocial Support/Ongoing Assessment of Needs Other assessment/ plan:   Information/referral to community resources:   SNF list    PATIENT'S/FAMILY'S RESPONSE TO PLAN OF CARE: Pt agreeable to ALF as he states he needs assistance with ADLs. He plans to pay privately.        Derenda Fennel, [place] [phone]

## 2012-10-22 ENCOUNTER — Encounter (HOSPITAL_COMMUNITY): Payer: Self-pay | Admitting: Internal Medicine

## 2012-10-22 DIAGNOSIS — I5189 Other ill-defined heart diseases: Secondary | ICD-10-CM

## 2012-10-22 DIAGNOSIS — R627 Adult failure to thrive: Secondary | ICD-10-CM

## 2012-10-22 HISTORY — DX: Other ill-defined heart diseases: I51.89

## 2012-10-22 LAB — COMPREHENSIVE METABOLIC PANEL
ALT: 93 U/L — ABNORMAL HIGH (ref 0–53)
AST: 33 U/L (ref 0–37)
Albumin: 3.5 g/dL (ref 3.5–5.2)
Alkaline Phosphatase: 47 U/L (ref 39–117)
BUN: 25 mg/dL — ABNORMAL HIGH (ref 6–23)
Chloride: 109 mEq/L (ref 96–112)
Potassium: 4.7 mEq/L (ref 3.5–5.1)
Sodium: 139 mEq/L (ref 135–145)
Total Bilirubin: 0.5 mg/dL (ref 0.3–1.2)
Total Protein: 6.2 g/dL (ref 6.0–8.3)

## 2012-10-22 LAB — TYPE AND SCREEN
Antibody Screen: NEGATIVE
Unit division: 0

## 2012-10-22 LAB — CANCER ANTIGEN 19-9: CA 19-9: 4.6 U/mL — ABNORMAL LOW (ref <35.0)

## 2012-10-22 LAB — GLUCOSE, CAPILLARY
Glucose-Capillary: 97 mg/dL (ref 70–99)
Glucose-Capillary: 114 mg/dL — ABNORMAL HIGH (ref 70–99)

## 2012-10-22 LAB — CBC
HCT: 27.6 % — ABNORMAL LOW (ref 39.0–52.0)
RDW: 13.2 % (ref 11.5–15.5)
WBC: 5.6 10*3/uL (ref 4.0–10.5)

## 2012-10-22 LAB — SEDIMENTATION RATE: Sed Rate: 20 mm/hr — ABNORMAL HIGH (ref 0–16)

## 2012-10-22 MED ORDER — VENLAFAXINE HCL 37.5 MG PO TABS
150.0000 mg | ORAL_TABLET | Freq: Two times a day (BID) | ORAL | Status: DC
  Administered 2012-10-22 – 2012-10-25 (×7): 150 mg via ORAL
  Filled 2012-10-22: qty 2
  Filled 2012-10-22 (×6): qty 4

## 2012-10-22 MED ORDER — MEGESTROL ACETATE 40 MG PO TABS
40.0000 mg | ORAL_TABLET | Freq: Two times a day (BID) | ORAL | Status: DC
  Administered 2012-10-22 – 2012-10-25 (×6): 40 mg via ORAL
  Filled 2012-10-22 (×10): qty 1

## 2012-10-22 MED ORDER — SODIUM CHLORIDE 0.9 % IV SOLN: INTRAVENOUS | Status: DC

## 2012-10-22 MED ORDER — ARIPIPRAZOLE 10 MG PO TABS
10.0000 mg | ORAL_TABLET | Freq: Every day | ORAL | Status: DC
  Administered 2012-10-22 – 2012-10-25 (×4): 10 mg via ORAL
  Filled 2012-10-22 (×4): qty 1

## 2012-10-22 NOTE — Clinical Social Work Placement (Signed)
Clinical Social Work Department CLINICAL SOCIAL WORK PLACEMENT NOTE 10/22/2012  Patient:  [name], [name]  Account Number:  [id number] Admit date:  10/20/2012  Clinical Social Worker:  Derenda Fennel, LCSW  Date/time:  10/21/2012 04:00 PM  Clinical Social Work is seeking post-discharge placement for this patient at the following level of care:   ASSISTED LIVING/REST HOME   (*CSW will update this form in Epic as items are completed)   10/21/2012  Patient/family provided with [facility] Department of Clinical Social Work's list of facilities offering this level of care within the geographic area requested by the patient (or if unable, by the patient's family).  10/21/2012  Patient/family informed of their freedom to choose among providers that offer the needed level of care, that participate in Medicare, Medicaid or managed care program needed by the patient, have an available bed and are willing to accept the patient.  10/21/2012  Patient/family informed of [facility]' ownership interest in [facility], as well as of the fact that they are under no obligation to receive care at this facility.  PASARR submitted to EDS on  PASARR number received from EDS on   FL2 transmitted to all facilities in geographic area requested by pt/family on  10/21/2012 FL2 transmitted to all facilities within larger geographic area on   Patient informed that his/her managed care company has contracts with or will negotiate with  certain facilities, including the following:     Patient/family informed of bed offers received:  10/22/2012 Patient chooses bed at  Physician recommends and patient chooses bed at    Patient to be transferred to  on   Patient to be transferred to facility by   The following physician request were entered in Epic:   Additional Comments: no pasarr needed as pt private pay. chose [facility].  Derenda Fennel, [place] [phone]

## 2012-10-22 NOTE — Progress Notes (Signed)
Physical Therapy Treatment Patient Details Name: [name] MRN: [id number] DOB: 1951/07/30 Today's Date: 10/22/2012 Time:  -     PT Assessment / Plan / Recommendation Comments on Treatment Session  Pt refused PT treatment today, pt stated he had already been for a walk today and did not want to participate.     Visit Information  Assistance Needed: +1 Reason Eval/Treat Not Completed: Other (comment) (Pt refused PT session, stated he had already walked today)     GP     Juel Burrow 10/22/2012, 2:54 PM

## 2012-10-22 NOTE — Care Management Note (Signed)
    Page 1 of 1   10/25/2012     4:44:52 PM   CARE MANAGEMENT NOTE 10/25/2012  Patient:  [name], [name]   Account Number:  [id number]  Date Initiated:  10/22/2012  Documentation initiated by:  Sharrie Rothman  Subjective/Objective Assessment:   Pt admitted from home with syncope and failure to thrive. Pt is currently living with his daughter and wife but he and his wife are separated. Pt would like placement at discharge.     Action/Plan:   CSW is aware and will discharge to facility when medically stable. CM will follow to arrange [facility] if d/c to ALF.   Anticipated DC Date:  10/25/2012   Anticipated DC Plan:  ASSISTED LIVING / REST HOME  In-house referral  Clinical Social Worker      DC Planning Services  CM consult      Choice offered to / List presented to:             Status of service:  Completed, signed off Medicare Important Message given?   (If response is "NO", the following Medicare IM given date fields will be blank) Date Medicare IM given:   Date Additional Medicare IM given:    Discharge Disposition:  SKILLED NURSING FACILITY  Per UR Regulation:    If discussed at Long Length of Stay Meetings, dates discussed:    Comments:  10/25/12 Rosemary Holms RN BSN CM  10/22/12 1234 Edwin Queen, RN BSN CM

## 2012-10-22 NOTE — Progress Notes (Signed)
Occupational Therapy Treatment Patient Details Name: [name] MRN: [id number] DOB: [birth year] Today's Date: 10/22/2012 Time: 0105-0130 OT Time Calculation (min): 25 min  OT Assessment / Plan / Recommendation Comments on Treatment Session A:  Pt. fatigues quickly but eagerly attempts any task.  Pt's ROM limited so did not add weight to UE exercises.  Pt. given 3 exercises to complete UE AROM.    Follow Up Recommendations       Barriers to Discharge       Equipment Recommendations       Recommendations for Other Services    Frequency     Plan      Precautions / Restrictions Precautions Precautions: Fall   Pertinent Vitals/Pain     ADL  Grooming: Performed;Wash/dry hands;Brushing hair Where Assessed - Grooming: Unsupported standing Toilet Transfer: Performed;Modified independent Toilet Transfer Method: Other (comment) (ambulated with RW) Toilet Transfer Equipment: Regular height toilet;Grab bars Toileting - Clothing Manipulation and Hygiene: Independent Where Assessed - Toileting Clothing Manipulation and Hygiene: Standing    OT Diagnosis:    OT Problem List:   OT Treatment Interventions:     OT Goals Acute Rehab OT Goals OT Goal Formulation: With patient Time For Goal Achievement: 11/04/12 Potential to Achieve Goals: Good ADL Goals Pt Will Perform Grooming: with modified independence;Standing at sink ADL Goal: Grooming - Progress: Met Pt Will Perform Upper Body Bathing: with modified independence;Sitting in shower ADL Goal: Upper Body Bathing - Progress: Progressing toward goals Pt Will Perform Lower Body Bathing: with modified independence;Sitting in shower ADL Goal: Lower Body Bathing - Progress: Progressing toward goals Pt Will Perform Upper Body Dressing: with modified independence;Sit to stand from bed ADL Goal: Upper Body Dressing - Progress: Progressing toward goals Pt Will Perform Lower Body Dressing: with modified independence;Sit to stand from  bed ADL Goal: Lower Body Dressing - Progress: Progressing toward goals Pt Will Transfer to Toilet: with modified independence;Regular height toilet;Ambulation ADL Goal: Toilet Transfer - Progress: Met Pt Will Perform Toileting - Clothing Manipulation: with modified independence;Sitting on 3-in-1 or toilet ADL Goal: Toileting - Clothing Manipulation - Progress: Met Pt Will Perform Toileting - Hygiene: with modified independence;Leaning right and/or left on 3-in-1/toilet;Sit to stand from 3-in-1/toilet ADL Goal: Toileting - Hygiene - Progress: Met Arm Goals Pt Will Complete Theraband Exer: with supervision, verbal cues required/provided;to increase strength;Bilateral upper extremities;2 sets;10 reps;Level 1 Theraband;Level 2 Theraband Additional Arm Goal #1: Patient will complete Hand weight exercises with supervision to increase strength bilateral upper extremities 2 sets 10 reps 1-2lbs. Arm Goal: Additional Goal #1 - Progress: Other (comment) (pt unable to use weights yet, has difficulty completing x10) Miscellaneous OT Goals Miscellaneous OT Goal #1: Patient will complete core strengthening exercises to increase functional performance during bathing and dressing tasks. OT Goal: Miscellaneous Goal #1 - Progress: Progressing toward goals  Visit Information  Last OT Received On: 10/22/12 Assistance Needed: +1    Subjective Data  Subjective: S"  I don't even look like myself."   Prior Functioning       Cognition  Overall Cognitive Status: Appears within functional limits for tasks assessed/performed Arousal/Alertness: Awake/alert Orientation Level: Appears intact for tasks assessed Behavior During Session: [facility] for tasks performed    Mobility  Shoulder Instructions         Exercises  Shoulder Exercises Shoulder Flexion: AROM;Seated;10 reps;Both Shoulder ABduction: AROM;Both;10 reps;Seated Shoulder External Rotation: AROM;Both;10 reps;Seated   Balance     End of Session OT -  End of Session Equipment Utilized During Treatment: Gait  belt;Other (comment) Activity Tolerance: Patient tolerated treatment well Patient left: in bed;with call bell/phone within reach;with bed alarm set  GO     Roberth Berling L 10/22/2012, 2:49 PM

## 2012-10-22 NOTE — Progress Notes (Addendum)
Subjective: Patient has no complaints of chest pain, shortness of breath, or abdominal pain. He had a large bowel movement yesterday, status post milk and molasses enema.  Objective: Vital signs in last 24 hours: Filed Vitals:   10/22/12 1007 10/22/12 1133 10/22/12 1135 10/22/12 1406  BP: 143/75 148/83 126/76 139/78  Pulse: 79 73 86 68  Temp:    98.1 F (36.7 C)  TempSrc:    Oral  Resp:    18  Height:      Weight:      SpO2:    99%    Intake/Output Summary (Last 24 hours) at 10/22/12 1609 Last data filed at 10/22/12 1200  Gross per 24 hour  Intake 1399.5 ml  Output      0 ml  Net 1399.5 ml    Weight change: 5.216 kg (11 lb 8 oz)  Physical exam: General: 61 year old thin Caucasian man sitting up in bed. He looks a lot better. Head: Stable soft hematoma on the posterior parietal region. Lungs: Clear to auscultation bilaterally. Heart: S1, S2, with no murmurs rubs or gallops. Abdomen: Well-healed vertical scar, soft, mildly tender at the left lower quadrant, no palpable stool. Extremities: No pedal edema. Small fluid collection on the left knee, nontender. Neurologic/psychiatric: Flat affect. Alert and oriented x3.  Lab Results: Basic Metabolic Panel:  Basename 10/22/12 0543 10/21/12 0535  NA 139 137  K 4.7 4.6  CL 109 107  CO2 21 21  GLUCOSE 100* 89  BUN 25* 33*  CREATININE 1.68* 1.63*  CALCIUM 9.3 9.0  MG -- --  PHOS -- --   Liver Function Tests:  [facility] 10/22/12 0543 10/21/12 0535  AST 33 55*  ALT 93* 117*  ALKPHOS 47 47  BILITOT 0.5 0.3  PROT 6.2 5.9*  ALBUMIN 3.5 3.3*    Basename 10/21/12 0535 10/20/12 1858  LIPASE 6* 6*  AMYLASE -- --   No results found for this basename: AMMONIA:2 in the last 72 hours CBC:  Basename 10/22/12 0543 10/21/12 0535 10/20/12 1306  WBC 5.6 3.3* --  NEUTROABS -- -- 4.1  HGB 9.4* 7.2* --  HCT 27.6* 21.2* --  MCV 89.6 91.4 --  PLT 180 161 --   Cardiac Enzymes:  Basename 10/21/12 0539 10/20/12 2351 10/20/12  1858 10/20/12 1857  CKTOTAL -- -- 108 --  CKMB -- -- -- --  CKMBINDEX -- -- -- --  TROPONINI <0.30 <0.30 -- <0.30   BNP: No results found for this basename: PROBNP:3 in the last 72 hours D-Dimer: No results found for this basename: DDIMER:2 in the last 72 hours CBG:  Basename 10/22/12 1128 10/22/12 0735 10/21/12 2210 10/21/12 1608 10/21/12 1111 10/21/12 0740  GLUCAP 106* 101* 141* 104* 108* 86   Hemoglobin A1C:  Basename 10/20/12 1857  HGBA1C 5.5   Fasting Lipid Panel: No results found for this basename: CHOL,HDL,LDLCALC,TRIG,CHOLHDL,LDLDIRECT in the last 72 hours Thyroid Function Tests:  Basename 10/20/12 1857  TSH 1.478  T4TOTAL --  FREET4 1.27  T3FREE --  THYROIDAB --   Anemia Panel:  Basename 10/20/12 1857  VITAMINB12 1009*  FOLATE 18.0  FERRITIN 620*  TIBC 261  IRON 39*  RETICCTPCT 0.7   Coagulation: No results found for this basename: LABPROT:2,INR:2 in the last 72 hours Urine Drug Screen: Drugs of Abuse  No results found for this basename: labopia,  cocainscrnur,  labbenz,  amphetmu,  thcu,  labbarb    Alcohol Level: No results found for this basename: ETH:2 in the last 72 hours  Urinalysis:  Basename 10/20/12 1650  COLORURINE YELLOW  LABSPEC 1.015  PHURINE 6.0  GLUCOSEU NEGATIVE  HGBUR NEGATIVE  BILIRUBINUR NEGATIVE  KETONESUR NEGATIVE  PROTEINUR NEGATIVE  UROBILINOGEN 0.2  NITRITE NEGATIVE  LEUKOCYTESUR NEGATIVE   Misc. Labs:   Micro: No results found for this or any previous visit (from the past 240 hour(s)).  Studies/Results: Ct Abdomen Pelvis Wo Contrast  10/20/2012  *RADIOLOGY REPORT*  Clinical Data: acute renal failure, history of pancreatic resection, diabetes, dysuria  CT ABDOMEN AND PELVIS WITHOUT CONTRAST  Technique:  Multidetector CT imaging of the abdomen and pelvis was performed following the standard protocol without intravenous contrast.  Comparison: 01/06/2011  Findings: Sagittal images of the spine shows no destructive  bony lesions.  Disc space flattening with vacuum disc phenomenon noted at L5 S1 level.  Lung bases are unremarkable.  Study is limited without IV contrast.  There is mild pneumobilia in the left hepatic lobe.  No intrahepatic biliary ductal dilatation.  Oral contrast material was given to the patient.  The visualized unenhanced pancreas is unremarkable.  Unenhanced spleen and adrenal glands are unremarkable.  Atherosclerotic calcifications of the abdominal aorta and the iliac arteries are noted.  Atherosclerotic calcifications of the SMA.  Unenhanced kidneys are symmetrical in size.  Nonspecific mild bilateral perinephric stranding.  At least three or four punctate nonobstructive calcifications are noted within the right kidney the largest in lower pole measures 2.2 mm.  At least three nonobstructive calcified calculi are noted within the left kidney the largest in mid pole measures 2.6 mm.  No hydronephrosis or hydroureter.  No calcified ureteral calculi are noted bilaterally.  No aortic aneurysm.  No small bowel obstruction.  No ascites or free air.  No adenopathy.  There is significant stool noted in the sigmoid colon.  No pericecal inflammation is noted.  The terminal ileum is unremarkable.  A distended urinary bladder is noted. Significant stool noted within the rectum.  The rectum measures at least 7.2 cm in diameter highly suspicious for fecal impaction.  Bilateral distal ureter is unremarkable.  No calcified calculi are noted within urinary bladder.  A metallic surgical clip is noted in the right upper scrotum. Prostate gland is unremarkable.  There is mild subcutaneous edema bilateral gluteal region.  IMPRESSION:  1.  The study is limited without IV contrast.  Mild pneumobilia is noted in the left hepatic lobe. 2.  Atherosclerotic calcifications of the abdominal aorta and the iliac arteries. 3.  There is bilateral nonobstructive nephrolithiasis.  No hydronephrosis or hydroureter.  No calcified ureteral  calculi are noted bilaterally. 4.  No small bowel obstruction. 5.  Significant stool noted within sigmoid colon and rectum.  The rectum is distended with stool measures at least 7.2 cm in diameter highly suspicious for distal fecal impaction. 6.  There is a distended urinary bladder.   Original Report Authenticated By: Natasha Mead, M.D.    US Carotid Duplex Bilateral  10/21/2012  *RADIOLOGY REPORT*  Clinical Data: Syncope and blurred vision.  History of hypertension and diabetes.  BILATERAL CAROTID DUPLEX ULTRASOUND  Technique: Wallace Cullens scale imaging, color Doppler and duplex ultrasound was performed of bilateral carotid and vertebral arteries in the neck.  Comparison:  None.  Criteria:  Quantification of carotid stenosis is based on velocity parameters that correlate the residual internal carotid diameter with NASCET-based stenosis levels, using the diameter of the distal internal carotid lumen as the denominator for stenosis measurement.  The following velocity measurements were obtained:  PEAK SYSTOLIC/END DIASTOLIC RIGHT ICA:                        119/36cm/sec CCA:                        92/12cm/sec SYSTOLIC ICA/CCA RATIO:     1.3 DIASTOLIC ICA/CCA RATIO:    2.9 ECA:                        146cm/sec  LEFT ICA:                        117/27cm/sec CCA:                        120/18cm/sec SYSTOLIC ICA/CCA RATIO:     1.0 DIASTOLIC ICA/CCA RATIO:    1.5 ECA:                        111cm/sec  Findings:  RIGHT CAROTID ARTERY: A mild amount of partially calcified plaque is present eccentrically at the level of the distal bulb and extending into the proximal internal carotid artery.  Estimated ICA stenosis is less than 50%.  RIGHT VERTEBRAL ARTERY:  Antegrade flow with normal wave form.  LEFT CAROTID ARTERY: Similar and slightly less prominent partially calcified plaque is present at the level of the carotid bulb and proximal ICA on the left.  Estimated ICA stenosis is less than 50%.  LEFT VERTEBRAL  ARTERY:  Antegrade flow with normal wave form.  IMPRESSION: Relatively mild amount of bilateral atherosclerotic plaque, right greater than left.  Estimated bilateral ICA stenoses are less than 50%.   Original Report Authenticated By: Irish Lack, M.D.       2-D echocardiogram:Study Conclusions  - Left ventricle: The cavity size was normal. Wall thickness was increased in a pattern of mild LVH. There was mild focal basal hypertrophy of the septum alongwith mild concentric hypertrophy. Systolic function was vigorous. The estimated ejection fraction was in the range of 60% to 65%. - Regional wall motion abnormality: Possible moderate to severe hypokinesis of the mid inferoseptal and apical septal myocardium. - Ventricular septum: Septal motion showed abnormal function and dyssynergy. These changes may be consistent with intraventricular conduction delay vs. ischemic CAD. - Aortic valve: Mildly calcified annulus. Trileaflet; normal thickness, mildly calcified leaflets. - Mitral valve: Mildly calcified annulus      Medications:  Scheduled:    . ARIPiprazole  10 mg Oral Daily  . feeding supplement  237 mL Oral TID BM  . insulin aspart  0-5 Units Subcutaneous QHS  . insulin aspart  0-9 Units Subcutaneous TID WC  . lipase/protease/amylase  2 capsule Oral TID WC  . metoprolol succinate  25 mg Oral Daily  . multivitamin with minerals  1 tablet Oral Daily  . pantoprazole  40 mg Oral Daily  . polyethylene glycol  17 g Oral Daily  . senna  1 tablet Oral BID  . [COMPLETED] tuberculin  5 Units Intradermal Once  . venlafaxine  150 mg Oral BID WC  . [DISCONTINUED] tuberculin  5 Units Intradermal Once   Continuous:    . 0.9 % NaCl with KCl 20 mEq / L 75 mL/hr at 10/22/12 1303   WUJ:[id number], acetaminophen, albuterol, alum & mag hydroxide-simeth, bisacodyl, guaiFENesin-dextromethorphan, morphine injection, ondansetron (ZOFRAN) IV, ondansetron, oxyCODONE,  traZODone  Assessment: Principal  Problem:  *Syncope and collapse Active Problems:  Syncope due to orthostatic hypotension  Anemia  Acute renal failure  Hypovolemia  Constipation  Dysuria  Unintentional weight loss  Abdominal pain, generalized  Traumatic hematoma of scalp  Arachnoid cyst  Type 2 diabetes mellitus  Failure to thrive in adult  Elevated LFTs  Abnormal EKG  Severe malnutrition   1. Syncope and collapse. Multiple episodes per history. His syncope is likely secondary to orthostatic hypotension. Orthostatic vital signs noted for hypotension upon standing. He may require a pharmacological agent to treat this if IV fluid hydration does not resolve it. His standing systolic blood pressure is still low, but much improved compared to yesterday. CT scan of his head revealed no acute intracranial findings. Carotid ultrasound reveals no significant ICA stenosis bilaterally. 2-D echocardiogram reveals preserved left ventricular systolic function, but moderate to severe inferioroseptal hypokinesis. Cortisol level is pending.  Moderate to severe hypokinesis of the inferior septal myocardium. The patient would benefit from cardiology consultation/evaluation in the outpatient setting. His cardiac enzymes are within normal limits, but he did have an abnormal EKG.  Hypovolemia. Resolving with IV fluids.  Acute renal failure. His has improved with IV fluids, but not back to baseline. His creatinine was within normal limits last year.  Anemia. His hemoglobin was 8.6 on admission. It has fallen to 7.2. This is likely in part dilutional. Status post 2 units of packed red blood cell transfusions with appropriate increase in his hemoglobin. His anemia panel is consistent with anemia of chronic disease. GI has been consulted. Dr. Karilyn Cota plans an upper endoscopy tomorrow. His stool is guaiac negative per GI PA., Ms. Setzer.  Elevated LFTs. CT scan of his abdomen was ordered for evaluation of  abdominal pain, constipation, and in the setting of elevated LFTs and history of pancreatic cancer. The findings were significant for fecal impaction in the rectum and mild pneumobilia in the left hepatic lobe (secondary to hepaticojejunostomy or choledochojejunostomy per GI). His liver transaminases have trended downward.  Fecal impaction/constipation. The patient had large bowel movements status post milk and molasses enema. We'll continue MiraLax and Senokot S. Further evaluation and recommendations per GI. Of note, his stool was guaiac negative per GI PA, Ms. Valetta Fuller.  Unintentional weight loss. The patient has a history of pancreatic cancer and pancreatic insufficiency. His lipase is actually low normal. He requires Creon with his meals. His weight loss may be a manifestation of depression, poor oral intake, and poor relationship with his wife. Apparently she does not cook but orders out quite a lot. His thyroid function is within normal limits.  Type 2 diabetes mellitus. Metformin is on hold. He is being treated with sliding scale NovoLog. His hemoglobin A1c is 5.5.  Severe malnutrition. Registered dietitian assessment and recommendations noted and appreciated. Will restart Megace which is one of his chronic medications.  Depression. Apparently, the patient takes Abilify and Effexor chronically. This was confirmed with his wife apparently. Both will be restarted.  Deconditioning and overall failure to thrive. The patient does not qualify for skilled nursing facility placement, however, he desires assisted living facility placement. He is willing to pay out of pocket. Offers have been made. It is likely he will be ready for discharge on Monday pending GI evaluation. Apparently, he does not have a good relationship with his wife. He does not want to be a burden to her according to him.   Plan:  1. If he continues to be severely orthostatic in the morning, following  2 days of IV fluids, would  discontinue IV fluids and start Midodrine. Will decrease IVF for now, given Echo results. 2. Await followup GI evaluation and recommendations. 3. Abilify and Effexor were restarted. 4. Restart Megace. 5. Discharge in the next couple days following GI evaluation. The patient will be going to an assisted living facility of choice. 6. Eventually, the patient will need an elective evaluation by cardiology for the hypokinesis seen on 2-D echocardiogram.    LOS: 2 days   Tijah Hane 10/22/2012, 4:09 PM

## 2012-10-22 NOTE — Progress Notes (Signed)
Tuberculin skin test administered in pt's L anterior forearm.

## 2012-10-22 NOTE — Progress Notes (Signed)
Patient feels better today. His appetite is better but not normal. He complains of postprandial nausea. He is having marital problems after having been married for 30 years. He says his wife does not want him to see doctors often. He remains with epigastric tenderness. Sedimentation rate is 20. Cortisol level is pending. Records from [facility] reviewed. He had pylorus sparing pancreaticoduodenectomy in April 2012. He had colonoscopy in August 2012 exam was incomplete to ascending colon. Recommendations; Will proceed with EGD in a.m.

## 2012-10-22 NOTE — Clinical Social Work Note (Signed)
CSW presented bed offer of [facility] in [place]. Pt spoke with administrator, Meriam Sprague and accepts bed at facility. Awaiting stability for d/c. [facility] aware of no pasarr due to private pay. Pt is agreeable to monthly rate of 1200 plus medications and understands is for semi-private room.  Derenda Fennel, [place] [phone]

## 2012-10-23 ENCOUNTER — Encounter (HOSPITAL_COMMUNITY): Payer: Self-pay | Admitting: *Deleted

## 2012-10-23 ENCOUNTER — Encounter (HOSPITAL_COMMUNITY)
Admission: EM | Disposition: A | Discharge status: Skilled Nursing Facility | Payer: Self-pay | Source: Home / Self Care | Attending: Internal Medicine

## 2012-10-23 DIAGNOSIS — K31819 Angiodysplasia of stomach and duodenum without bleeding: Secondary | ICD-10-CM

## 2012-10-23 DIAGNOSIS — K449 Diaphragmatic hernia without obstruction or gangrene: Secondary | ICD-10-CM

## 2012-10-23 HISTORY — PX: ESOPHAGOGASTRODUODENOSCOPY: SHX5428

## 2012-10-23 LAB — BASIC METABOLIC PANEL
Calcium: 9.5 mg/dL (ref 8.4–10.5)
GFR calc Af Amer: 53 mL/min — ABNORMAL LOW (ref >90)
GFR calc non Af Amer: 46 mL/min — ABNORMAL LOW (ref >90)
Glucose, Bld: 105 mg/dL — ABNORMAL HIGH (ref 70–99)
Potassium: 4.6 mEq/L (ref 3.5–5.1)
Sodium: 136 mEq/L (ref 135–145)

## 2012-10-23 LAB — GLUCOSE, CAPILLARY
Glucose-Capillary: 105 mg/dL — ABNORMAL HIGH (ref 70–99)
Glucose-Capillary: 82 mg/dL (ref 70–99)
Glucose-Capillary: 130 mg/dL — ABNORMAL HIGH (ref 70–99)

## 2012-10-23 LAB — CBC
Hemoglobin: 10.1 g/dL — ABNORMAL LOW (ref 13.0–17.0)
MCH: 30.8 pg (ref 26.0–34.0)
Platelets: 166 10*3/uL (ref 150–400)
RBC: 3.28 MIL/uL — ABNORMAL LOW (ref 4.22–5.81)

## 2012-10-23 SURGERY — EGD (ESOPHAGOGASTRODUODENOSCOPY)
Anesthesia: Moderate Sedation

## 2012-10-23 MED ORDER — BACITRACIN-NEOMYCIN-POLYMYXIN 400-5-5000 EX OINT: TOPICAL_OINTMENT | CUTANEOUS | Status: DC | PRN

## 2012-10-23 MED ORDER — MIDAZOLAM HCL 5 MG/5ML IJ SOLN
INTRAMUSCULAR | Status: AC
  Filled 2012-10-23: qty 10

## 2012-10-23 MED ORDER — SODIUM CHLORIDE 0.45 % IV SOLN
INTRAVENOUS | Status: DC
  Administered 2012-10-23: 09:00:00 via INTRAVENOUS
  Administered 2012-10-23: 20 mL/h via INTRAVENOUS

## 2012-10-23 MED ORDER — MEPERIDINE HCL 50 MG/ML IJ SOLN
INTRAMUSCULAR | Status: AC
  Filled 2012-10-23: qty 1

## 2012-10-23 MED ORDER — STERILE WATER FOR IRRIGATION IR SOLN
Status: DC | PRN
  Administered 2012-10-23: 08:00:00

## 2012-10-23 MED ORDER — MEPERIDINE HCL 25 MG/ML IJ SOLN
INTRAMUSCULAR | Status: DC | PRN
  Administered 2012-10-23: 25 mg via INTRAVENOUS

## 2012-10-23 MED ORDER — BACITRACIN-NEOMYCIN-POLYMYXIN OINTMENT TUBE
TOPICAL_OINTMENT | CUTANEOUS | Status: DC | PRN
  Administered 2012-10-23: 1 via TOPICAL
  Filled 2012-10-23: qty 15

## 2012-10-23 MED ORDER — BUTAMBEN-TETRACAINE-BENZOCAINE 2-2-14 % EX AERO
INHALATION_SPRAY | CUTANEOUS | Status: DC | PRN
  Administered 2012-10-23: 2 via TOPICAL

## 2012-10-23 MED ORDER — MIDAZOLAM HCL 5 MG/5ML IJ SOLN
INTRAMUSCULAR | Status: DC | PRN
  Administered 2012-10-23 (×2): 2 mg via INTRAVENOUS

## 2012-10-23 NOTE — Progress Notes (Signed)
[facility]             Progress Note   Subjective: Patient reports feeling a little better today. He has not had any chest pain. He does not feel dizzy while sitting down, but has not gotten up to walk.  Objective: Vital signs in last 24 hours: Temp:  [97.5 F (36.4 C)-98.8 F (37.1 C)] 98.1 F (36.7 C) (12/07 1316) Pulse Rate:  [68-87] 87  (12/07 1316) Resp:  [9-18] 18  (12/07 1316) BP: (129-165)/(60-88) 155/82 mmHg (12/07 1316) SpO2:  [98 %-100 %] 100 % (12/07 1316) Weight change:  Last BM Date: 10/21/12  Intake/Output from previous day: 12/06 0701 - 12/07 0700 In: 4584.3 [P.O.:480; I.V.:4098.3; IV Piggyback:6] Out: -      Physical Exam: General: Alert, awake, oriented x3, in no acute distress. HEENT: No bruits, no goiter. Heart: Regular rate and rhythm, without murmurs, rubs, gallops. Lungs: Clear to auscultation bilaterally. Abdomen: Soft, nontender, nondistended, positive bowel sounds. Extremities: No clubbing cyanosis or edema with positive pedal pulses. Neuro: Grossly intact, nonfocal.    Lab Results: Basic Metabolic Panel:  Basename 10/23/12 0625 10/22/12 0543  NA 136 139  K 4.6 4.7  CL 105 109  CO2 22 21  GLUCOSE 105* 100*  BUN 21 25*  CREATININE 1.58* 1.68*  CALCIUM 9.5 9.3  MG -- --  PHOS -- --   Liver Function Tests:  [facility] 10/22/12 0543 10/21/12 0535  AST 33 55*  ALT 93* 117*  ALKPHOS 47 47  BILITOT 0.5 0.3  PROT 6.2 5.9*  ALBUMIN 3.5 3.3*    Basename 10/21/12 0535 10/20/12 1858  LIPASE 6* 6*  AMYLASE -- --   No results found for this basename: AMMONIA:2 in the last 72 hours CBC:  Basename 10/23/12 0625 10/22/12 0543  WBC 4.2 5.6  NEUTROABS -- --  HGB 10.1* 9.4*  HCT 29.2* 27.6*  MCV 89.0 89.6  PLT 166 180   Cardiac Enzymes:  Basename 10/21/12 0539 10/20/12 2351 10/20/12 1858 10/20/12 1857  CKTOTAL -- -- 108 --  CKMB -- -- -- --  CKMBINDEX -- -- -- --  TROPONINI <0.30 <0.30 -- <0.30   BNP: No  results found for this basename: PROBNP:3 in the last 72 hours D-Dimer: No results found for this basename: DDIMER:2 in the last 72 hours CBG:  Basename 10/23/12 1223 10/23/12 0743 10/22/12 2109 10/22/12 1633 10/22/12 1128 10/22/12 0735  GLUCAP 134* 105* 114* 97 106* 101*   Hemoglobin A1C:  Basename 10/20/12 1857  HGBA1C 5.5   Fasting Lipid Panel: No results found for this basename: CHOL,HDL,LDLCALC,TRIG,CHOLHDL,LDLDIRECT in the last 72 hours Thyroid Function Tests:  Basename 10/20/12 1857  TSH 1.478  T4TOTAL --  FREET4 1.27  T3FREE --  THYROIDAB --   Anemia Panel:  Basename 10/20/12 1857  VITAMINB12 1009*  FOLATE 18.0  FERRITIN 620*  TIBC 261  IRON 39*  RETICCTPCT 0.7   Coagulation: No results found for this basename: LABPROT:2,INR:2 in the last 72 hours Urine Drug Screen: Drugs of Abuse  No results found for this basename: labopia, cocainscrnur, labbenz, amphetmu, thcu, labbarb    Alcohol Level: No results found for this basename: ETH:2 in the last 72 hours Urinalysis:  Basename 10/20/12 1650  COLORURINE YELLOW  LABSPEC 1.015  PHURINE 6.0  GLUCOSEU NEGATIVE  HGBUR NEGATIVE  BILIRUBINUR NEGATIVE  KETONESUR NEGATIVE  PROTEINUR NEGATIVE  UROBILINOGEN 0.2  NITRITE NEGATIVE  LEUKOCYTESUR NEGATIVE    No results found for this or any previous visit (from  the past 240 hour(s)).  Studies/Results: No results found.  Medications: Scheduled Meds:   . ARIPiprazole  10 mg Oral Daily  . feeding supplement  237 mL Oral TID BM  . insulin aspart  0-5 Units Subcutaneous QHS  . insulin aspart  0-9 Units Subcutaneous TID WC  . lipase/protease/amylase  2 capsule Oral TID WC  . megestrol  40 mg Oral BID  . meperidine      . metoprolol succinate  25 mg Oral Daily  . midazolam      . multivitamin with minerals  1 tablet Oral Daily  . pantoprazole  40 mg Oral Daily  . polyethylene glycol  17 g Oral Daily  . senna  1 tablet Oral BID  . venlafaxine  150 mg Oral  BID WC   Continuous Infusions:   . sodium chloride 20 mL/hr (10/23/12 1041)  . 0.9 % NaCl with KCl 20 mEq / L 50 mL/hr at 10/23/12 0618  . [DISCONTINUED] sodium chloride     PRN Meds:.acetaminophen, acetaminophen, albuterol, alum & mag hydroxide-simeth, bisacodyl, guaiFENesin-dextromethorphan, morphine injection, ondansetron (ZOFRAN) IV, ondansetron, oxyCODONE, traZODone, [DISCONTINUED] butamben-tetracaine-benzocaine, [DISCONTINUED] meperidine, [DISCONTINUED] midazolam, [DISCONTINUED] simethicone susp in sterile water 1000 mL irrigation  Assessment/Plan:  Principal Problem:  *Syncope and collapse Active Problems:  Syncope due to orthostatic hypotension  Acute renal failure  Hypovolemia  Constipation  Dysuria  Unintentional weight loss  Abdominal pain, generalized  Traumatic hematoma of scalp  Arachnoid cyst  Anemia  Type 2 diabetes mellitus  Failure to thrive in adult  Elevated LFTs  Abnormal EKG  Severe malnutrition  Ventricular hypokinesis   1. Syncope and collapse. Likely secondary to orthostatic hypotension. This seems to be somewhat improved with IV fluids. 2-D echocardiogram showed preserved ejection fraction although there was mention of inferoseptal hypokinesis. This will need further evaluation by cardiology. Cortisol level is currently pending. We will incomplete the patient today and repeat orthostatics.  2. Moderate to severe hypokinesis of the inferior septal myocardium. Patient has not had any chest pain. His cardiac enzymes are negative. This can likely be evaluated in the outpatient setting by cardiology.  3. Acute renal failure. Possibly secondary to hypovolemia in the setting of ACE inhibitors. Renal function appears to be stable to mildly improved. We will repeat labs in the morning. Continue IV fluids for now.   4. Anemia. Patient underwent transfusion of 2 units of PRBCs. Hemoglobin had fallen to 7.2, but has improved after blood transfusions. His stool was  found to be guaiac-negative. 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 He'll be continued on PPI and pancreatic enzyme supplements.   5. Type 2 diabetes. Metformin is currently on hold, but can be resumed in the outpatient setting.   6. Depression. We started on Abilify and Effexor.   7. Deconditioning and overall failure to thrive. Patient has been considering placement in an assisted-living facility. Social work has been involved. He is now saying that he may want to go home. He will discuss this further with his wife. We will followup regarding patient's wishes.   Time spent coordinating care:   LOS: 3 days   Garrette Caine [facility] Pager: [phone] 10/23/2012, 1:40 PM

## 2012-10-23 NOTE — Op Note (Signed)
EGD PROCEDURE REPORT  PATIENT:  [name]  MR#:  [id number] Birthdate:  10/04/1951, 61 y.o., male Endoscopist:  Dr. Malissa Hippo, MD Procedure Date: 10/23/2012  Procedure:   EGD  Indications:  Patient is 61 year old Caucasian male with history of pancreatic adenocarcinoma who underwent pylorus sparing and had her duodenectomy in April 2012 who now presents with continued weight loss nausea anorexia as well as abdominal pain. He also has orthostatic hypotension. Patient underwent abdominopelvic CT suggests wall thickening in the region of anastomosis. He is undergoing diagnostic EGD.            Informed Consent:  The risks, benefits, alternatives & imponderables which include, but are not limited to, bleeding, infection, perforation, drug reaction and potential missed lesion have been reviewed.  The potential for biopsy, lesion removal, esophageal dilation, etc. have also been discussed.  Questions have been answered.  All parties agreeable.  Please see history & physical in medical record for more information.  Medications:  Demerol 25 mg IV Versed 4 mg IV Cetacaine spray topically for oropharyngeal anesthesia  Description of procedure:  The endoscope was introduced through the mouth and advanced to the second portion of the duodenum without difficulty or limitations. The mucosal surfaces were surveyed very carefully during advancement of the scope and upon withdrawal.  Findings:  Esophagus:  Mucosa of the esophagus was normal. Wavy GE junction no erosions or ulcers noted. GEJ:  36 cm Hiatus:  39 cm Stomach:  Stomach was empty and distended very well with insufflation. Folds in the proximal stomach are normal. Examination of mucosa at body was normal. Multiple telangiectasia were noted at antrum without stigmata of bleeding. Pyloric channel was patent. Angularis fundus and cardia were examined by retroflexing the scope and were normal. Small Bowel: Both afferent any further loops are  examined and no mucosal abnormalities noted.  Therapeutic/Diagnostic Maneuvers Performed:  None  Complications:  none  Impression: Small sliding hiatal hernia. 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 Patent pylorus with wide-open anastomosis and normal-appearing afferent and efferent jejunal loops.   Recommendations:  Continue PPI and pancreatic enzyme supplement. Await results of cortisol level. Since his last colonoscopy was incomplete may consider one at a later date.  [name]  10/23/2012  9:20 AM  CC: Dr. Provider Not In System & Dr. No ref. provider found

## 2012-10-23 NOTE — Progress Notes (Signed)
Notified Dr.  Kerry Hough about the patient having abrasion like areas on his penis. New orders given and followed.

## 2012-10-24 DIAGNOSIS — K5900 Constipation, unspecified: Secondary | ICD-10-CM

## 2012-10-24 LAB — OCCULT BLOOD X 1 CARD TO LAB, STOOL: Fecal Occult Bld: NEGATIVE

## 2012-10-24 LAB — BASIC METABOLIC PANEL
BUN: 23 mg/dL (ref 6–23)
CO2: 23 mEq/L (ref 19–32)
Calcium: 9.8 mg/dL (ref 8.4–10.5)
GFR calc non Af Amer: 46 mL/min — ABNORMAL LOW (ref >90)
Glucose, Bld: 105 mg/dL — ABNORMAL HIGH (ref 70–99)
Potassium: 4.3 mEq/L (ref 3.5–5.1)
Sodium: 136 mEq/L (ref 135–145)

## 2012-10-24 LAB — GLUCOSE, CAPILLARY: Glucose-Capillary: 94 mg/dL (ref 70–99)

## 2012-10-24 LAB — CBC
Hemoglobin: 10.7 g/dL — ABNORMAL LOW (ref 13.0–17.0)
MCH: 31.4 pg (ref 26.0–34.0)
MCHC: 35.5 g/dL (ref 30.0–36.0)
MCV: 88.3 fL (ref 78.0–100.0)
RBC: 3.41 MIL/uL — ABNORMAL LOW (ref 4.22–5.81)

## 2012-10-24 LAB — CORTISOL-AM, BLOOD: Cortisol - AM: 17 ug/dL (ref 4.3–22.4)

## 2012-10-24 NOTE — Progress Notes (Signed)
[facility]             Progress Note   Subjective: Says he is feeling better, has some nausea, slightly dizzy on standing, but orthostatics are improved.  Objective: Vital signs in last 24 hours: Temp:  [98 F (36.7 C)-98.4 F (36.9 C)] 98.4 F (36.9 C) (12/08 0534) Pulse Rate:  [69-96] 96  (12/08 1135) Resp:  [16-17] 17  (12/08 0907) BP: (116-167)/(73-94) 116/73 mmHg (12/08 1135) SpO2:  [98 %-100 %] 100 % (12/08 0907) Weight change:  Last BM Date: 10/21/12  Intake/Output from previous day: 12/07 0701 - 12/08 0700 In: 600 [P.O.:600] Out: 300 [Urine:300] Total I/O In: 360 [P.O.:360] Out: -    Physical Exam: General: Alert, awake, oriented x3, in no acute distress. HEENT: No bruits, no goiter. Heart: Regular rate and rhythm, without murmurs, rubs, gallops. Lungs: Clear to auscultation bilaterally. Abdomen: Soft, nontender, nondistended, positive bowel sounds. Extremities: No clubbing cyanosis or edema with positive pedal pulses. Neuro: Grossly intact, nonfocal.    Lab Results: Basic Metabolic Panel:  Basename 10/24/12 0655 10/23/12 0625  NA 136 136  K 4.3 4.6  CL 103 105  CO2 23 22  GLUCOSE 105* 105*  BUN 23 21  CREATININE 1.58* 1.58*  CALCIUM 9.8 9.5  MG -- --  PHOS -- --   Liver Function Tests:  [facility] 10/22/12 0543  AST 33  ALT 93*  ALKPHOS 47  BILITOT 0.5  PROT 6.2  ALBUMIN 3.5   No results found for this basename: LIPASE:2,AMYLASE:2 in the last 72 hours No results found for this basename: AMMONIA:2 in the last 72 hours CBC:  Basename 10/24/12 0655 10/23/12 0625  WBC 3.8* 4.2  NEUTROABS -- --  HGB 10.7* 10.1*  HCT 30.1* 29.2*  MCV 88.3 89.0  PLT 165 166   Cardiac Enzymes: No results found for this basename: CKTOTAL:3,CKMB:3,CKMBINDEX:3,TROPONINI:3 in the last 72 hours BNP: No results found for this basename: PROBNP:3 in the last 72 hours D-Dimer: No results found for this basename: DDIMER:2 in the last 72  hours CBG:  Basename 10/24/12 1116 10/24/12 0734 10/23/12 2207 10/23/12 1557 10/23/12 1223 10/23/12 0743  GLUCAP 104* 94 130* 82 134* 105*   Hemoglobin A1C: No results found for this basename: HGBA1C in the last 72 hours Fasting Lipid Panel: No results found for this basename: CHOL,HDL,LDLCALC,TRIG,CHOLHDL,LDLDIRECT in the last 72 hours Thyroid Function Tests: No results found for this basename: TSH,T4TOTAL,FREET4,T3FREE,THYROIDAB in the last 72 hours Anemia Panel: No results found for this basename: VITAMINB12,FOLATE,FERRITIN,TIBC,IRON,RETICCTPCT in the last 72 hours Coagulation: No results found for this basename: LABPROT:2,INR:2 in the last 72 hours Urine Drug Screen: Drugs of Abuse  No results found for this basename: labopia,  cocainscrnur,  labbenz,  amphetmu,  thcu,  labbarb    Alcohol Level: No results found for this basename: ETH:2 in the last 72 hours Urinalysis: No results found for this basename: COLORURINE:2,APPERANCEUR:2,LABSPEC:2,PHURINE:2,GLUCOSEU:2,HGBUR:2,BILIRUBINUR:2,KETONESUR:2,PROTEINUR:2,UROBILINOGEN:2,NITRITE:2,LEUKOCYTESUR:2 in the last 72 hours  No results found for this or any previous visit (from the past 240 hour(s)).  Studies/Results: No results found.  Medications: Scheduled Meds:    . ARIPiprazole  10 mg Oral Daily  . feeding supplement  237 mL Oral TID BM  . insulin aspart  0-5 Units Subcutaneous QHS  . insulin aspart  0-9 Units Subcutaneous TID WC  . lipase/protease/amylase  2 capsule Oral TID WC  . megestrol  40 mg Oral BID  . [EXPIRED] meperidine      . metoprolol succinate  25 mg Oral Daily  . [  EXPIRED] midazolam      . multivitamin with minerals  1 tablet Oral Daily  . pantoprazole  40 mg Oral Daily  . polyethylene glycol  17 g Oral Daily  . senna  1 tablet Oral BID  . venlafaxine  150 mg Oral BID WC   Continuous Infusions:    . [DISCONTINUED] sodium chloride 20 mL/hr (10/23/12 1041)  . [DISCONTINUED] 0.9 % NaCl with KCl 20  mEq / L 50 mL/hr at 10/23/12 0618   PRN Meds:.acetaminophen, acetaminophen, albuterol, alum & mag hydroxide-simeth, bisacodyl, guaiFENesin-dextromethorphan, morphine injection, neomycin-bacitracin-polymyxin, ondansetron (ZOFRAN) IV, ondansetron, oxyCODONE, traZODone, [DISCONTINUED] neomycin-bacitracin-polymyxin  Assessment/Plan:  Principal Problem:  *Syncope and collapse Active Problems:  Syncope due to orthostatic hypotension  Acute renal failure  Hypovolemia  Constipation  Dysuria  Unintentional weight loss  Abdominal pain, generalized  Traumatic hematoma of scalp  Arachnoid cyst  Anemia  Type 2 diabetes mellitus  Failure to thrive in adult  Elevated LFTs  Abnormal EKG  Severe malnutrition  Ventricular hypokinesis   1. Syncope and collapse. Likely secondary to orthostatic hypotension. This seems to be somewhat improved with IV fluids. 2-D echocardiogram showed preserved ejection fraction although there was mention of inferoseptal hypokinesis. This will need further evaluation by cardiology. Cortisol level is currently pending. Patient was ambulated without significant difficulty and orthostatics appear improving.  Will prescribe TED hose and observe orthostatic precautions.  2. Moderate to severe hypokinesis of the inferior septal myocardium. Patient has not had any chest pain. His cardiac enzymes are negative. This can likely be evaluated in the outpatient setting by cardiology.  3. Acute renal failure. Possibly secondary to hypovolemia in the setting of ACE inhibitors. Renal function appears to be stable to mildly improved. We'll continue to hold ACE inhibitors for now. He will need a repeat basic metabolic panel in approximately a week.  4. Anemia. Patient underwent transfusion of 2 units of PRBCs. Hemoglobin had fallen to 7.2, but has improved after blood transfusions. His stool was found to be guaiac-negative. 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 He'll be continued on PPI and pancreatic enzyme supplements. He followup with gastroenterology in one month.  5. Type 2 diabetes. Metformin is currently on hold, but can be resumed in the outpatient setting.   6. Depression. Was started on Abilify and Effexor.   7. Deconditioning and overall failure to thrive. Patient wishes to discharge to an assisted-living facility. He does not have adequate family support at home. Social work is following. Hopeful for discharge to ALF tomorrow.  Time spent coordinating care:   LOS: 4 days   [name] [facility] Pager: [phone] 10/24/2012, 1:37 PM

## 2012-10-24 NOTE — Progress Notes (Signed)
Subjective; Patient feels better. He complains of nausea. He ate all of his breakfast. He complains of mild epigastric pain. He had good bowel movement this morning. He walked in the hall and felt somewhat unsteady but he did not feel like he was going to pass out. Objective; BP 116/73  Pulse 96  Temp 98.4 F (36.9 C) (Oral)  Resp 17  Ht 6' (1.829 m)  Wt 149 lb 1.6 oz (67.631 kg)  BMI 20.22 kg/m2  SpO2 100% His abdomen is soft with mild midepigastric tenderness. Lab data; WBC 3.8, H&H 10.7 and 30.1 Lately at count 165K Cortisol level is pending CA 19-9 is 4.6 which is low. Assessment; Overall patient is feeling much better. His oral intake appears to be satisfactory. He may have an element of gastroparesis to account for his nausea. EGD yesterday did not reveal anastomotic stricture or peptic ulcer disease. Was found to have gave without stigmata of bleeding in his anemia does not appear to be iron deficiency anemia. He has mild leukopenia of unclear etiology platelet count is normal. Recommendations; Continue PPI and pancreatic enzyme supplement. Continued therapy for constipation. Will plan to see in office in one month.

## 2012-10-24 NOTE — Progress Notes (Signed)
Notified Dr. Earlyne Iba via text of pts cyst like area on the pts knee.  [name] states its from his fall at home.  He does not c/o pain with palatation.

## 2012-10-24 NOTE — Plan of Care (Signed)
Problem: Phase II Progression Outcomes Goal: Progress activity as tolerated unless otherwise ordered Outcome: Completed/Met Date Met:  10/24/12 Pt gets oob to the chair and also have ambulated in the hall way.

## 2012-10-24 NOTE — Progress Notes (Signed)
Read the pt's TB test in the left arm.  It was non-reactive 0mm. The test was negative.

## 2012-10-25 DIAGNOSIS — E119 Type 2 diabetes mellitus without complications: Secondary | ICD-10-CM

## 2012-10-25 DIAGNOSIS — E861 Hypovolemia: Secondary | ICD-10-CM

## 2012-10-25 LAB — GLUCOSE, CAPILLARY
Glucose-Capillary: 93 mg/dL (ref 70–99)
Glucose-Capillary: 90 mg/dL (ref 70–99)

## 2012-10-25 MED ORDER — MEGESTROL ACETATE 40 MG PO TABS: 40.0000 mg | ORAL_TABLET | Freq: Two times a day (BID) | ORAL | Status: DC

## 2012-10-25 MED ORDER — PANCRELIPASE (LIP-PROT-AMYL) 12000-38000 UNITS PO CPEP: 2.0000 | ORAL_CAPSULE | Freq: Three times a day (TID) | ORAL | Status: DC

## 2012-10-25 MED ORDER — METFORMIN HCL 1000 MG PO TABS: 1000.0000 mg | ORAL_TABLET | Freq: Two times a day (BID) | ORAL | Status: DC

## 2012-10-25 MED ORDER — ARIPIPRAZOLE 10 MG PO TABS: 10.0000 mg | ORAL_TABLET | Freq: Every day | ORAL | Status: DC

## 2012-10-25 MED ORDER — PANTOPRAZOLE SODIUM 40 MG PO TBEC: 40.0000 mg | DELAYED_RELEASE_TABLET | Freq: Every day | ORAL | Status: AC

## 2012-10-25 MED ORDER — POLYETHYLENE GLYCOL 3350 17 G PO PACK: 17.0000 g | PACK | Freq: Every day | ORAL | Status: DC

## 2012-10-25 MED ORDER — GABAPENTIN 100 MG PO CAPS: 100.0000 mg | ORAL_CAPSULE | Freq: Three times a day (TID) | ORAL | Status: DC

## 2012-10-25 MED ORDER — VENLAFAXINE HCL 75 MG PO TABS: 150.0000 mg | ORAL_TABLET | Freq: Two times a day (BID) | ORAL | Status: DC

## 2012-10-25 MED ORDER — METOPROLOL SUCCINATE ER 25 MG PO TB24: 25.0000 mg | ORAL_TABLET | Freq: Every day | ORAL | Status: DC

## 2012-10-25 NOTE — Clinical Social Work Note (Addendum)
Pt d/c today to [facility] on [address]. FL2 and D/C summary faxed. TB skin test negative. Pt states someone in his family will transport. RN aware to call [facility] if family can't pick up.   Derenda Fennel, [place] [phone]

## 2012-10-25 NOTE — Clinical Social Work Placement (Signed)
Clinical Social Work Department CLINICAL SOCIAL WORK PLACEMENT NOTE 10/25/2012  Patient:  JOBY, RICHART  Account Number:  [id number] Admit date:  10/20/2012  Clinical Social Worker:  Derenda Fennel, LCSW  Date/time:  10/21/2012 04:00 PM  Clinical Social Work is seeking post-discharge placement for this patient at the following level of care:   ASSISTED LIVING/REST HOME   (*CSW will update this form in Epic as items are completed)   10/21/2012  Patient/family provided with [facility] Department of Clinical Social Work's list of facilities offering this level of care within the geographic area requested by the patient (or if unable, by the patient's family).  10/21/2012  Patient/family informed of their freedom to choose among providers that offer the needed level of care, that participate in Medicare, Medicaid or managed care program needed by the patient, have an available bed and are willing to accept the patient.  10/21/2012  Patient/family informed of [facility]' ownership interest in [facility], as well as of the fact that they are under no obligation to receive care at this facility.  PASARR submitted to EDS on  PASARR number received from E[employer] on   FL2 transmitted to all facilities in geographic area requested by pt/family on  10/21/2012 FL2 transmitted to all facilities within larger geographic area on   Patient informed that his/her managed care company has contracts with or will negotiate with  certain facilities, including the following:     Patient/family informed of bed offers received:  10/22/2012 Patient chooses bed at  Physician recommends and patient chooses bed at    Patient to be transferred to  on  10/25/2012 Patient to be transferred to facility by family  The following physician request were entered in Epic:   Additional Comments: no pasarr needed as pt private pay. chose [facility].  Derenda Fennel, [place] [phone]

## 2012-10-25 NOTE — Discharge Summary (Addendum)
Physician Discharge Summary  [name] JXB:[id number] DOB: 05-11-1951 DOA: 10/20/2012  PCP: Provider Not In System  Admit date: 10/20/2012 Discharge date: 10/25/2012  Time spent: 40 minutes  Recommendations for Outpatient Follow-up:  1. Patient will be discharged to an assisted living facility 2. Followup with cardiology on 12/20 3. Followup with Dr. Karilyn Cota in one month 4. Repeat basic chemistries in one week  Discharge Diagnoses:  Principal Problem:  *Syncope and collapse Active Problems:  Syncope due to orthostatic hypotension  Acute renal failure  Hypovolemia  Constipation  Dysuria  Unintentional weight loss  Abdominal pain, generalized  Traumatic hematoma of scalp  Arachnoid cyst  Anemia  Type 2 diabetes mellitus  Failure to thrive in adult  Elevated LFTs  Abnormal EKG  Severe malnutrition  Ventricular hypokinesis   Discharge Condition: stable  Diet recommendation: low salt, low carb  Filed Weights   10/21/12 0442 10/22/12 0500 10/25/12 0558  Weight: 67.767 kg (149 lb 6.4 oz) 67.631 kg (149 lb 1.6 oz) 63.7 kg (140 lb 6.9 oz)    History of present illness:  [name] is a 61 y.o. male with a history significant for adenocarcinoma of the pancreas, status post resection and chemotherapy, diabetes mellitus, and hypertension, who presented to the emergency department today after losing consciousness on multiple occasions. Over the past week, he says that he passed out at least 20 times. Most of the time, he becomes lightheaded and then blacks out. Occasionally, there is no warning. In general, when he passes out, it is after he stands up from sitting or laying. He developed a knot on the back of his head from hitting his head on the floor. He has sore hips from falling so much. He denies nausea, vomiting, or diarrhea. He has had some pain with urination and urinary hesitancy. He has had occasional abdominal pain which he thinks may be associated with constipation.  His last bowel movement was 3 days ago. On occasion, his stool is black and tarry. He has had a 50 pound weight loss this year, unintentional. He denies difficulty swallowing or pain with eating. He says that his appetite has been "pretty good", but a knowledge is that he probably does not drink as much water as he should. No recent fever, chills, or night sweats.  The patient called EMS. He reports that the EMT checked his blood pressure laying down and then standing up. He reports that his blood pressure fell and his heart rate increased when he stood up. In the emergency department, he was initially tachycardic with a heart rate of 129 beats per minute, but it is now 89 beats per minute. His blood pressure is within normal limits. He is oxygenating 99%. His lab data are significant for a hemoglobin of 8.6, BUN of 45, creatinine of 1.89, AST of 74, and ALT of 145. His urinalysis is unremarkable. CT scan of his head reveals a small scalp hematoma on the left and posterior parietal region and a posterior fossa arachnoid cyst, but no intracranial hemorrhage or infarct. CT scan of his neck reveals no acute fracture. He is being admitted for further evaluation and management.   Hospital Course:  The stone was admitted to the hospital with an episode of syncope. He was found to be dehydrated and was mildly anemic. He was also tachycardic on admission and significantly orthostatic. He was admitted to the hospital for further treatment. He was adequately rehydrated. He did receive 2 units of packed red blood cells with improvement  of his hemoglobin. Stools were found to be heme-negative. He was seen by Dr. Karilyn Cota from gastroenterology and underwent endoscopy. Results are noted as below. Regarding his syncope he was extensively evaluated. He was monitored on telemetry he was not in any significant arrhythmia or sinus pauses. 2-D echocardiogram was done which did show some wall motion abnormalities. He did have a  preserved ejection fraction. Cardiac enzymes have been negative x3 and has had chest pain. We recommended an outpatient cardiology evaluation and this has been arranged for him. Patient did have an elevation in his creatinine. His ACE inhibitor was held and he was given IV fluids. His renal function appears to be stable with a creatinine of 1.5. We will continue to hold his ACE inhibitor for now. We will recommend a repeat basic metabolic panel be checked one week. His orthostasis has resolved. Blood pressure stable on sitting and standing. We have recommended TED stockings as well as orthostatic precautions. The patient is admitted to discharge an assisted-living facility since he does not feel that he has adequate help/support at home. He'll be discharged today.  Procedures:  Echocardiogram shows EF of 60-65%. Possible moderate to severe hypokinesis of the mid inferoseptal and apical septal myocardium. Ventricular septum showed abnormal function and dyssynergy. Endoscopy:Small sliding hiatal hernia.  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  Patent pylorus with wide-open anastomosis and normal-appearing afferent and efferent jejunal loops.    Consultations:  GI, Dr. Karilyn Cota  Discharge Exam: Filed Vitals:   10/25/12 0558 10/25/12 0944 10/25/12 1135 10/25/12 1300  BP: 147/81 143/83 139/87 154/82  Pulse: 83 85 91 80  Temp: 98.3 F (36.8 C) 98.6 F (37 C)  98 F (36.7 C)  TempSrc: Oral Oral    Resp:  16 20 20   Height:      Weight: 63.7 kg (140 lb 6.9 oz)     SpO2: 99% 100%  100%    General: NAD Cardiovascular: s1, s2, rrr Respiratory: cta b  Discharge Instructions      Discharge Orders    Future Appointments: Provider: Department: Dept Phone: Center:   11/05/2012 1:45 PM Kathlen Brunswick, MD [facility] [phone] [id number]     Future Orders Please Complete By Expires   Diet - low sodium heart healthy      Increase activity  slowly          Medication List     As of 10/25/2012  3:04 PM    STOP taking these medications         quinapril 10 MG tablet   Commonly known as: ACCUPRIL      zolpidem 10 MG tablet   Commonly known as: AMBIEN      TAKE these medications         ARIPiprazole 10 MG tablet   Commonly known as: ABILIFY   Take 1 tablet (10 mg total) by mouth daily.      gabapentin 100 MG capsule   Commonly known as: NEURONTIN   Take 1 capsule (100 mg total) by mouth 3 (three) times daily.      lipase/protease/amylase 95638 UNITS Cpep   Commonly known as: CREON-10/PANCREASE   Take 2 capsules by mouth 3 (three) times daily with meals.      megestrol 40 MG tablet   Commonly known as: MEGACE   Take 1 tablet (40 mg total) by mouth 2 (two) times daily.      metFORMIN 1000 MG tablet   Commonly known as:  GLUCOPHAGE   Take 1 tablet (1,000 mg total) by mouth 2 (two) times daily.      metoprolol succinate 25 MG 24 hr tablet   Commonly known as: TOPROL-XL   Take 1 tablet (25 mg total) by mouth daily.      pantoprazole 40 MG tablet   Commonly known as: PROTONIX   Take 1 tablet (40 mg total) by mouth daily.      polyethylene glycol packet   Commonly known as: MIRALAX / GLYCOLAX   Take 17 g by mouth daily.      venlafaxine 75 MG tablet   Commonly known as: EFFEXOR   Take 2 tablets (150 mg total) by mouth 2 (two) times daily.        Follow-up Information    Follow up with Seguin Bing, MD. On 11/05/2012. (1:45pm, at [facility] cardiology clinic)    Contact information:   [address] [place] [address] [phone]       Follow up with Malissa Hippo, MD. Schedule an appointment as soon as possible for a visit in 1 month.   Contact information:   [address], [address] [place] [address] [phone]       Follow up with Provider Not In System. (in 2 weeks)    Contact information:   [facility]          The results of significant  diagnostics from this hospitalization (including imaging, microbiology, ancillary and laboratory) are listed below for reference.    Significant Diagnostic Studies: Ct Abdomen Pelvis Wo Contrast  10/20/2012  *RADIOLOGY REPORT*  Clinical Data: acute renal failure, history of pancreatic resection, diabetes, dysuria  CT ABDOMEN AND PELVIS WITHOUT CONTRAST  Technique:  Multidetector CT imaging of the abdomen and pelvis was performed following the standard protocol without intravenous contrast.  Comparison: 01/06/2011  Findings: Sagittal images of the spine shows no destructive bony lesions.  Disc space flattening with vacuum disc phenomenon noted at L5 S1 level.  Lung bases are unremarkable.  Study is limited without IV contrast.  There is mild pneumobilia in the left hepatic lobe.  No intrahepatic biliary ductal dilatation.  Oral contrast material was given to the patient.  The visualized unenhanced pancreas is unremarkable.  Unenhanced spleen and adrenal glands are unremarkable.  Atherosclerotic calcifications of the abdominal aorta and the iliac arteries are noted.  Atherosclerotic calcifications of the SMA.  Unenhanced kidneys are symmetrical in size.  Nonspecific mild bilateral perinephric stranding.  At least three or four punctate nonobstructive calcifications are noted within the right kidney the largest in lower pole measures 2.2 mm.  At least three nonobstructive calcified calculi are noted within the left kidney the largest in mid pole measures 2.6 mm.  No hydronephrosis or hydroureter.  No calcified ureteral calculi are noted bilaterally.  No aortic aneurysm.  No small bowel obstruction.  No ascites or free air.  No adenopathy.  There is significant stool noted in the sigmoid colon.  No pericecal inflammation is noted.  The terminal ileum is unremarkable.  A distended urinary bladder is noted. Significant stool noted within the rectum.  The rectum measures at least 7.2 cm in diameter highly suspicious  for fecal impaction.  Bilateral distal ureter is unremarkable.  No calcified calculi are noted within urinary bladder.  A metallic surgical clip is noted in the right upper scrotum. Prostate gland is unremarkable.  There is mild subcutaneous edema bilateral gluteal region.  IMPRESSION:  1.  The  study is limited without IV contrast.  Mild pneumobilia is noted in the left hepatic lobe. 2.  Atherosclerotic calcifications of the abdominal aorta and the iliac arteries. 3.  There is bilateral nonobstructive nephrolithiasis.  No hydronephrosis or hydroureter.  No calcified ureteral calculi are noted bilaterally. 4.  No small bowel obstruction. 5.  Significant stool noted within sigmoid colon and rectum.  The rectum is distended with stool measures at least 7.2 cm in diameter highly suspicious for distal fecal impaction. 6.  There is a distended urinary bladder.   Original Report Authenticated By: Natasha Mead, M.D.    Ct Head Wo Contrast  10/20/2012  *RADIOLOGY REPORT*  Clinical Data: *RADIOLOGY REPORT*  Clinical Data:  Loss of consciousness with trauma  CT HEAD WITHOUT CONTRAST CT CERVICAL SPINE WITHOUT CONTRAST  Technique:  Multidetector CT imaging of the head and cervical spine was performed following the standard protocol without intravenous contrast.  Multiplanar CT image reconstructions of the cervical spine were also generated.  Comparison:   None  CT HEAD  Findings:  Ventricles are normal in size and configuration. There is an arachnoid cyst in the posterior fossa just to the left of midline posteriorly measuring 3.0 x 1.7 cm in size. There is no other evidence of mass.  There is no hemorrhage, extra-axial fluid collection, or midline shift.  There are no gray-white compartment lesions.  There is a small scalp hematoma in the left superior posterior parietal region.  Bony calvarium appears intact.  The mastoid air cells are clear.  IMPRESSION: Small scalp hematoma left superior and posterior parietal region.  Posterior fossa arachnoid cyst.3 no intra-axial mass, hemorrhage, or evidence of focal infarct.  CT CERVICAL SPINE  Findings: There is no fracture or spondylolisthesis.  Prevertebral soft tissues and predental space regions are normal.  There is moderate disc space narrowing at C3-4. Other disc spaces appear intact.  There is calcification in the posterior longitudinal ligament at C3-4.  There is facet hypertrophy at several levels bilaterally.  There is no disc extrusion or stenosis.  IMPRESSION: Osteoarthritic change.  No fracture or spondylolisthesis.  No erosive change or bony destruction.  CT HEAD WITHOUT CONTRAST  Technique:  Contiguous axial images were obtained from the base of the skull through the vertex without contrast.  Comparison: None.  Findings:  IMPRESSION:   Original Report Authenticated By: Bretta Bang, M.D.    US Carotid Duplex Bilateral  10/21/2012  *RADIOLOGY REPORT*  Clinical Data: Syncope and blurred vision.  History of hypertension and diabetes.  BILATERAL CAROTID DUPLEX ULTRASOUND  Technique: Wallace Cullens scale imaging, color Doppler and duplex ultrasound was performed of bilateral carotid and vertebral arteries in the neck.  Comparison:  None.  Criteria:  Quantification of carotid stenosis is based on velocity parameters that correlate the residual internal carotid diameter with NASCET-based stenosis levels, using the diameter of the distal internal carotid lumen as the denominator for stenosis measurement.  The following velocity measurements were obtained:                   PEAK SYSTOLIC/END DIASTOLIC RIGHT ICA:                        119/36cm/sec CCA:                        92/12cm/sec SYSTOLIC ICA/CCA RATIO:     1.3 DIASTOLIC ICA/CCA RATIO:    2.9 ECA:  146cm/sec  LEFT ICA:                        117/27cm/sec CCA:                        120/18cm/sec SYSTOLIC ICA/CCA RATIO:     1.0 DIASTOLIC ICA/CCA RATIO:    1.5 ECA:                        111cm/sec  Findings:   RIGHT CAROTID ARTERY: A mild amount of partially calcified plaque is present eccentrically at the level of the distal bulb and extending into the proximal internal carotid artery.  Estimated ICA stenosis is less than 50%.  RIGHT VERTEBRAL ARTERY:  Antegrade flow with normal wave form.  LEFT CAROTID ARTERY: Similar and slightly less prominent partially calcified plaque is present at the level of the carotid bulb and proximal ICA on the left.  Estimated ICA stenosis is less than 50%.  LEFT VERTEBRAL ARTERY:  Antegrade flow with normal wave form.  IMPRESSION: Relatively mild amount of bilateral atherosclerotic plaque, right greater than left.  Estimated bilateral ICA stenoses are less than 50%.   Original Report Authenticated By: Irish Lack, M.D.    Dg Chest Portable 1 View  10/20/2012  *RADIOLOGY REPORT*  Clinical Data: Loss of consciousness.  Pancreatic cancer.  PORTABLE CHEST - 1 VIEW  Comparison: CT chest and chest radiograph 01/06/2011.  Findings: Trachea is midline.  Heart size normal.  Lungs are clear. No pleural fluid.  IMPRESSION: No acute findings.   Original Report Authenticated By: Leanna Battles, M.D.     Microbiology: No results found for this or any previous visit (from the past 240 hour(s)).   Labs: Basic Metabolic Panel:  Lab 10/24/12 9147 10/23/12 0625 10/22/12 0543 10/21/12 0535 10/20/12 1306  NA 136 136 139 137 137  K 4.3 4.6 4.7 4.6 4.3  CL 103 105 109 107 104  CO2 23 22 21 21 25   GLUCOSE 105* 105* 100* 89 139*  BUN 23 21 25* 33* 45*  CREATININE 1.58* 1.58* 1.68* 1.63* 1.89*  CALCIUM 9.8 9.5 9.3 9.0 9.3  MG -- -- -- -- --  PHOS -- -- -- -- --   Liver Function Tests:  Lab 10/22/12 0543 10/21/12 0535 10/20/12 1306  AST 33 55* 74*  ALT 93* 117* 145*  ALKPHOS 47 47 56  BILITOT 0.5 0.3 0.3  PROT 6.2 5.9* 6.9  ALBUMIN 3.5 3.3* 3.8    Lab 10/21/12 0535 10/20/12 1858  LIPASE 6* 6*  AMYLASE -- --   No results found for this basename: AMMONIA:5 in the last 168  hours CBC:  Lab 10/24/12 0655 10/23/12 0625 10/22/12 0543 10/21/12 0535 10/20/12 1306  WBC 3.8* 4.2 5.6 3.3* 5.1  NEUTROABS -- -- -- -- 4.1  HGB 10.7* 10.1* 9.4* 7.2* 8.6*  HCT 30.1* 29.2* 27.6* 21.2* 25.2*  MCV 88.3 89.0 89.6 91.4 91.6  PLT 165 166 180 161 169   Cardiac Enzymes:  Lab 10/21/12 0539 10/20/12 2351 10/20/12 1858 10/20/12 1857 10/20/12 1306  CKTOTAL -- -- 108 -- --  CKMB -- -- -- -- --  CKMBINDEX -- -- -- -- --  TROPONINI <0.30 <0.30 -- <0.30 <0.30   BNP: BNP (last 3 results) No results found for this basename: PROBNP:3 in the last 8760 hours CBG:  Lab 10/25/12 1206 10/25/12 0740 10/24/12 2115 10/24/12 1627 10/24/12 1116  GLUCAP 90 93 110* 128* 104*       Signed:  MEMON,JEHANZEB  [facility] 10/25/2012, 3:04 PM

## 2012-10-25 NOTE — Progress Notes (Addendum)
1735-pt. Taken to [facility]  Assisted living per father-in-law. Pt. Taken to car via W/C,discharge instructions given to pt. With teach back given to RN.

## 2012-10-26 ENCOUNTER — Encounter (HOSPITAL_COMMUNITY): Payer: Self-pay | Admitting: Internal Medicine

## 2012-11-03 ENCOUNTER — Emergency Department (HOSPITAL_COMMUNITY)
Admission: EM | Admit: 2012-11-03 | Discharge: 2012-11-03 | Disposition: A | Discharge status: Home / Self Care | Payer: Self-pay | Attending: Emergency Medicine | Admitting: Emergency Medicine

## 2012-11-03 ENCOUNTER — Emergency Department (HOSPITAL_COMMUNITY): Payer: Self-pay

## 2012-11-03 ENCOUNTER — Encounter (HOSPITAL_COMMUNITY): Payer: Self-pay

## 2012-11-03 DIAGNOSIS — Z8679 Personal history of other diseases of the circulatory system: Secondary | ICD-10-CM | POA: Insufficient documentation

## 2012-11-03 DIAGNOSIS — E46 Unspecified protein-calorie malnutrition: Secondary | ICD-10-CM

## 2012-11-03 DIAGNOSIS — E119 Type 2 diabetes mellitus without complications: Secondary | ICD-10-CM | POA: Insufficient documentation

## 2012-11-03 DIAGNOSIS — Z8509 Personal history of malignant neoplasm of other digestive organs: Secondary | ICD-10-CM | POA: Insufficient documentation

## 2012-11-03 DIAGNOSIS — K219 Gastro-esophageal reflux disease without esophagitis: Secondary | ICD-10-CM | POA: Insufficient documentation

## 2012-11-03 DIAGNOSIS — R0609 Other forms of dyspnea: Secondary | ICD-10-CM | POA: Insufficient documentation

## 2012-11-03 DIAGNOSIS — R0600 Dyspnea, unspecified: Secondary | ICD-10-CM

## 2012-11-03 DIAGNOSIS — I10 Essential (primary) hypertension: Secondary | ICD-10-CM | POA: Insufficient documentation

## 2012-11-03 DIAGNOSIS — F329 Major depressive disorder, single episode, unspecified: Secondary | ICD-10-CM | POA: Insufficient documentation

## 2012-11-03 DIAGNOSIS — F3289 Other specified depressive episodes: Secondary | ICD-10-CM | POA: Insufficient documentation

## 2012-11-03 DIAGNOSIS — N289 Disorder of kidney and ureter, unspecified: Secondary | ICD-10-CM | POA: Insufficient documentation

## 2012-11-03 DIAGNOSIS — E440 Moderate protein-calorie malnutrition: Secondary | ICD-10-CM | POA: Insufficient documentation

## 2012-11-03 DIAGNOSIS — Z79899 Other long term (current) drug therapy: Secondary | ICD-10-CM | POA: Insufficient documentation

## 2012-11-03 DIAGNOSIS — R0989 Other specified symptoms and signs involving the circulatory and respiratory systems: Secondary | ICD-10-CM | POA: Insufficient documentation

## 2012-11-03 LAB — CBC WITH DIFFERENTIAL/PLATELET
Eosinophils Absolute: 0.2 10*3/uL (ref 0.0–0.7)
Eosinophils Relative: 4 % (ref 0–5)
HCT: 31.9 % — ABNORMAL LOW (ref 39.0–52.0)
Hemoglobin: 11 g/dL — ABNORMAL LOW (ref 13.0–17.0)
Lymphs Abs: 0.8 10*3/uL (ref 0.7–4.0)
MCH: 30.6 pg (ref 26.0–34.0)
MCHC: 34.5 g/dL (ref 30.0–36.0)
MCV: 88.6 fL (ref 78.0–100.0)
Monocytes Absolute: 0.6 10*3/uL (ref 0.1–1.0)
Monocytes Relative: 9 % (ref 3–12)
RBC: 3.6 MIL/uL — ABNORMAL LOW (ref 4.22–5.81)

## 2012-11-03 LAB — BASIC METABOLIC PANEL
BUN: 52 mg/dL — ABNORMAL HIGH (ref 6–23)
Calcium: 9.7 mg/dL (ref 8.4–10.5)
Creatinine, Ser: 2.38 mg/dL — ABNORMAL HIGH (ref 0.50–1.35)
GFR calc non Af Amer: 28 mL/min — ABNORMAL LOW (ref >90)
Glucose, Bld: 89 mg/dL (ref 70–99)
Potassium: 4.9 mEq/L (ref 3.5–5.1)

## 2012-11-03 LAB — HEPATIC FUNCTION PANEL
Albumin: 4.4 g/dL (ref 3.5–5.2)
Alkaline Phosphatase: 49 U/L (ref 39–117)
Total Protein: 7.8 g/dL (ref 6.0–8.3)

## 2012-11-03 LAB — LIPASE, BLOOD: Lipase: 6 U/L — ABNORMAL LOW (ref 11–59)

## 2012-11-03 MED ORDER — TECHNETIUM TC 99M DIETHYLENETRIAME-PENTAACETIC ACID
40.0000 | Freq: Once | INTRAVENOUS | Status: AC | PRN
  Administered 2012-11-03: 40.3 via RESPIRATORY_TRACT

## 2012-11-03 MED ORDER — TECHNETIUM TO 99M ALBUMIN AGGREGATED
6.0000 | Freq: Once | INTRAVENOUS | Status: AC | PRN
  Administered 2012-11-03: 6 via INTRAVENOUS

## 2012-11-03 MED ORDER — SODIUM CHLORIDE 0.9 % IV BOLUS (SEPSIS)
1000.0000 mL | Freq: Once | INTRAVENOUS | Status: AC
  Administered 2012-11-03: 1000 mL via INTRAVENOUS

## 2012-11-03 MED ORDER — PANCRELIPASE (LIP-PROT-AMYL) 12000-38000 UNITS PO CPEP
2.0000 | ORAL_CAPSULE | Freq: Once | ORAL | Status: AC
  Administered 2012-11-03: 2 via ORAL
  Filled 2012-11-03: qty 2

## 2012-11-03 NOTE — ED Notes (Signed)
Pharmacy notified of pancrease order

## 2012-11-03 NOTE — Discharge Instructions (Signed)
Your kidney function called creatinine was  elevated at 2.38       This will need to be rechecked by your primary care Dr.      Otherwise your VQ scan which looked at the possibility of a blood clot in your lung was normal.  CBC normal.   Chemistry panel normal with the exception of creatinine as noted and a low CO2..   Try to eat as many calories as you can.

## 2012-11-03 NOTE — ED Notes (Addendum)
PT aware [facility] called for ride back. Nad. Pt color better and more energy observed at this time. SW came down and talked with pt and gave information

## 2012-11-03 NOTE — Clinical Social Work Note (Addendum)
CSW called by ED to see pt. Pt known to CSW from previous admission where he chose to go to [facility]. Pt reports there are roaches and dirty dishes at facility and that staff sometimes yell. CSW provided lists for [facility] and [facility]. However, he states he plans to return there tonight and will have his wife take home tomorrow to stay. He originally said he did not plan to report above information. However, he changed his mind and CSW provided number for pt to share concerns. CSW spoke with supervisor regarding situation. Pt was clean upon arriving to ED. He was complaining of SOB. Facility will pick up pt from ED as he has been d/c.   Derenda Fennel, LCSW [phone]

## 2012-11-03 NOTE — ED Provider Notes (Signed)
History   This chart was scribed for Donnetta Hutching, MD by Gerlean Ren, ED Scribe. This patient was seen in room APA02/APA02 and the patient's care was started at 11:28 AM    CSN: [id number]  Arrival date & time 11/03/12  1055   First MD Initiated Contact with Patient 11/03/12 1112      Chief Complaint  Patient presents with  . Shortness of Breath     The history is provided by the patient. No language interpreter was used.   [name] is a 61 y.o. male with h/o pancreatic adenocarcinoma, DM-type 2, and HTN who presents to the Emergency Department complaining of worsening exertional dyspnea that prevents pt from walking further than 73ft with associated strong, thumping heartbeat when ambulating.  Pt reports this is not completely new, but that he could previously walk up to 118ft before becoming dyspneic.  Pt denies any current pain and states he does not feel short of breath when lying flat.  Pt denies tobacco and alcohol use.  PCP at [facility] Past Medical History  Diagnosis Date  . Hypertension   . Diabetes mellitus without complication   . Pancreatic adenocarcinoma 2011    Status post resection and chemotherapy  . Depression   . Jaundice 2012    Secondary to occluded biliary stent.  Marland Kitchen GERD (gastroesophageal reflux disease)   . Anemia   . Severe malnutrition 10/21/2012  . 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 Pancreatic resection 2011  . Ercp with biliary stent change 2012    Dr. Karilyn Cota  . Esophagogastroduodenoscopy 10/23/2012    Procedure: ESOPHAGOGASTRODUODENOSCOPY (EGD);  Surgeon: Malissa Hippo, MD;  Location: [facility];  Service: Endoscopy;  Laterality: N/A;    No family history on file.  History  Substance Use Topics  . Smoking status: Never Smoker   . Smokeless tobacco: Never Used  . Alcohol Use: No      Review of Systems A complete 10 system review of systems  was obtained and all systems are negative except as noted in the HPI and PMH.   Allergies  Review of patient's allergies indicates no known allergies.  Home Medications   Current Outpatient Rx  Name  Route  Sig  Dispense  Refill  . ARIPIPRAZOLE 10 MG PO TABS   Oral   Take 1 tablet (10 mg total) by mouth daily.   30 tablet   1   . GABAPENTIN 100 MG PO CAPS   Oral   Take 1 capsule (100 mg total) by mouth 3 (three) times daily.   90 capsule   1   . PANCRELIPASE (LIP-PROT-AMYL) 12000 UNITS PO CPEP   Oral   Take 2 capsules by mouth 3 (three) times daily with meals.   270 capsule   1   . MEGESTROL ACETATE 40 MG PO TABS   Oral   Take 1 tablet (40 mg total) by mouth 2 (two) times daily.   60 tablet   1   . METFORMIN HCL 1000 MG PO TABS   Oral   Take 1 tablet (1,000 mg total) by mouth 2 (two) times daily.   60 tablet   1   . METOPROLOL SUCCINATE ER 25 MG PO TB24   Oral   Take 1 tablet (25 mg total) by mouth daily.   30 tablet   1   . PANTOPRAZOLE SODIUM 40 MG PO TBEC  Oral   Take 1 tablet (40 mg total) by mouth daily.   30 tablet   1   . POLYETHYLENE GLYCOL 3350 PO PACK   Oral   Take 17 g by mouth daily.   14 each   1   . VENLAFAXINE HCL 75 MG PO TABS   Oral   Take 2 tablets (150 mg total) by mouth 2 (two) times daily.   60 tablet   1     BP 111/74  Pulse 100  Temp 99.5 F (37.5 C) (Rectal)  Resp 21  Ht 6' (1.829 m)  Wt 125 lb (56.7 kg)  BMI 16.95 kg/m2  SpO2 100%  Physical Exam  Nursing note and vitals reviewed. Constitutional: He is oriented to person, place, and time. He appears well-developed.       Gaunt  HENT:  Head: Normocephalic and atraumatic.  Eyes: Conjunctivae normal and EOM are normal. Pupils are equal, round, and reactive to light.  Neck: Normal range of motion. Neck supple.  Cardiovascular: Normal rate, regular rhythm and normal heart sounds.   Pulmonary/Chest: Effort normal and breath sounds normal.  Abdominal: Soft. Bowel  sounds are normal.  Musculoskeletal: Normal range of motion. He exhibits no edema.  Neurological: He is alert and oriented to person, place, and time.  Skin: Skin is warm and dry.  Psychiatric: He has a normal mood and affect.    ED Course  Procedures (including critical care time) DIAGNOSTIC STUDIES: Oxygen Saturation is 100% on room air, normal by my interpretation.    COORDINATION OF CARE: 11:32 AM- Patient informed of clinical course, understands medical decision-making process, and agrees with plan.  Ordered CBC, b-met, troponin I, D-dimer, and chest XR.  Labs Reviewed  CBC WITH DIFFERENTIAL - Abnormal; Notable for the following:    RBC 3.60 (*)     Hemoglobin 11.0 (*)     HCT 31.9 (*)     All other components within normal limits  BASIC METABOLIC PANEL - Abnormal; Notable for the following:    CO2 16 (*)     BUN 52 (*)     Creatinine, Ser 2.38 (*)     GFR calc non Af Amer 28 (*)     GFR calc Af Amer 32 (*)     All other components within normal limits  D-DIMER, QUANTITATIVE - Abnormal; Notable for the following:    D-Dimer, Quant 3.85 (*)     All other components within normal limits  LIPASE, BLOOD - Abnormal; Notable for the following:    Lipase 6 (*)     All other components within normal limits  TROPONIN I  HEPATIC FUNCTION PANEL  TROPONIN I  TROPONIN I   Dg Chest 2 View  11/03/2012  *RADIOLOGY REPORT*  Clinical Data: Shortness of breath and cough.  CHEST - 2 VIEW  Comparison: 10/20/2012  Findings: Hyperexpansion is consistent with emphysema.  There is no focal airspace consolidation.  No pulmonary edema or pleural effusion. The cardiopericardial silhouette is within normal limits for size. Imaged bony structures of the thorax are intact.  IMPRESSION: Stable.  Hyperexpansion without acute cardiopulmonary findings.   Original Report Authenticated By: Kennith Center, M.D.     Date: 11/03/2012  Rate: 89  Rhythm: normal sinus rhythm  QRS Axis: normal  Intervals:  normal  ST/T Wave abnormalities: normal  Conduction Disutrbances:right bundle branch block and left anterior fascicular block  Narrative Interpretation:   Old EKG Reviewed: changes noted   No diagnosis found.  MDM  No obvious reason for dyspnea other than malnutrition, deconditioning.  Creatinine elevated at 2.38     This was discussed with the patient.   VQ scan shows very low possibility for pulmonary embolism.     Low CO2 noted.   Patient has primary care followup.   No dyspnea at rest.  I personally performed the services described in this documentation, which was scribed in my presence. The recorded information has been reviewed and is accurate.        Donnetta Hutching, MD 11/03/12 [phone]

## 2012-11-03 NOTE — ED Notes (Signed)
Pt c/o sob with standing and activity. States fine being stationary. Pt cbg en route 96. Pt started living at [facility] family care x 1-2 weeks ago. Pt pale. Alert/oriented. C/o gen weakness. gen weakness observed. Denies cough/chest pressure/n/v. Nondiaphoretic. No resp distress or sob noted. Mm dry

## 2012-11-05 ENCOUNTER — Telehealth: Payer: Self-pay | Admitting: Adult Health

## 2012-11-05 ENCOUNTER — Encounter: Payer: Self-pay | Admitting: Cardiology

## 2012-11-05 DIAGNOSIS — I10 Essential (primary) hypertension: Secondary | ICD-10-CM | POA: Insufficient documentation

## 2012-11-05 DIAGNOSIS — K831 Obstruction of bile duct: Secondary | ICD-10-CM | POA: Insufficient documentation

## 2012-11-05 DIAGNOSIS — E1129 Type 2 diabetes mellitus with other diabetic kidney complication: Secondary | ICD-10-CM | POA: Insufficient documentation

## 2012-11-05 DIAGNOSIS — C259 Malignant neoplasm of pancreas, unspecified: Secondary | ICD-10-CM | POA: Insufficient documentation

## 2012-11-05 NOTE — Telephone Encounter (Signed)
Called to reschedule No Show/contact number had been disconnected/letter mailed on 11/05/12/tgs

## 2012-11-11 NOTE — Progress Notes (Signed)
This encounter was created in error - please disregard.

## 2012-11-22 ENCOUNTER — Ambulatory Visit (INDEPENDENT_AMBULATORY_CARE_PROVIDER_SITE_OTHER): Payer: Self-pay | Admitting: Internal Medicine

## 2013-03-03 IMAGING — CR DG SHOULDER 3+V*L*
1 series · 3 of 3 positions shown · non-contrast
Comparison: none

REASON FOR EXAM: fell in bathroom -landed on left shoulder/arm and left
side face
COMMENTS:

PROCEDURE:     DXR - DXR SHOULDER LEFT COMPLETE  - October 09, 2012  [DATE]
RESULT:     Left shoulder images show the humeral head located in the
glenoid. There is no fracture or dislocation. There is no bony destruction
or foreign body.

[Series 1: internal rotate · 0.17mm/px · 3 of 3 slices shown]
[im 1/3]
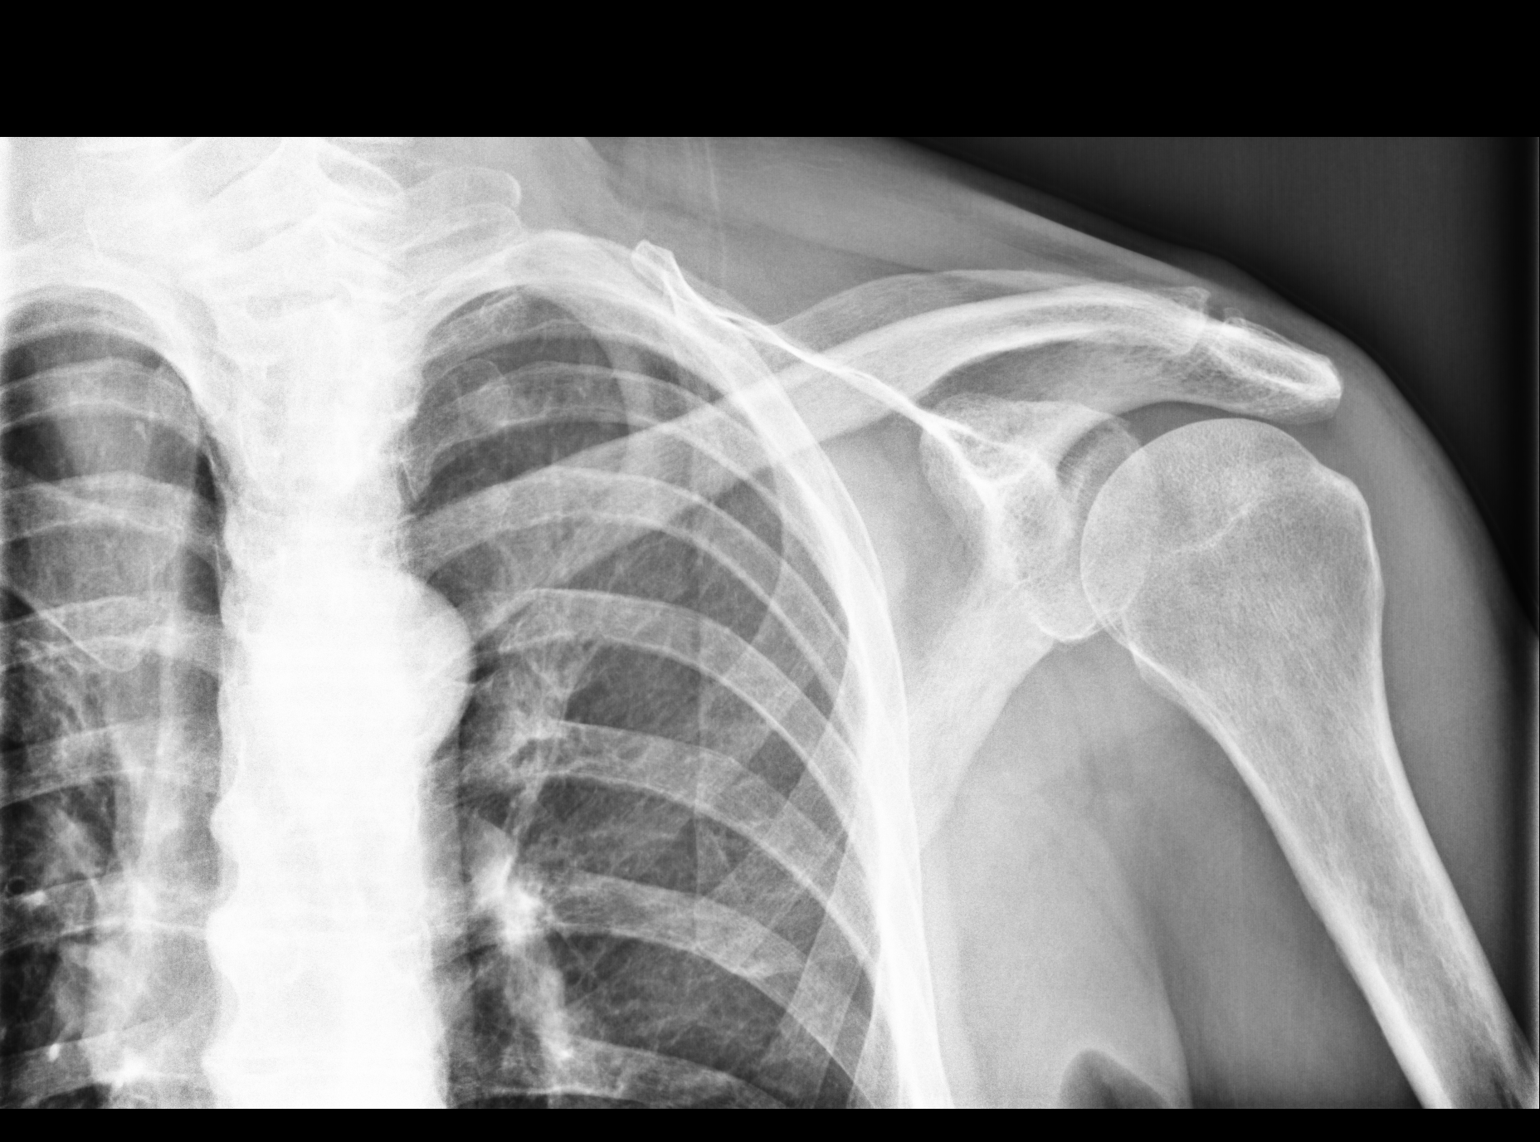
[im 2/3]
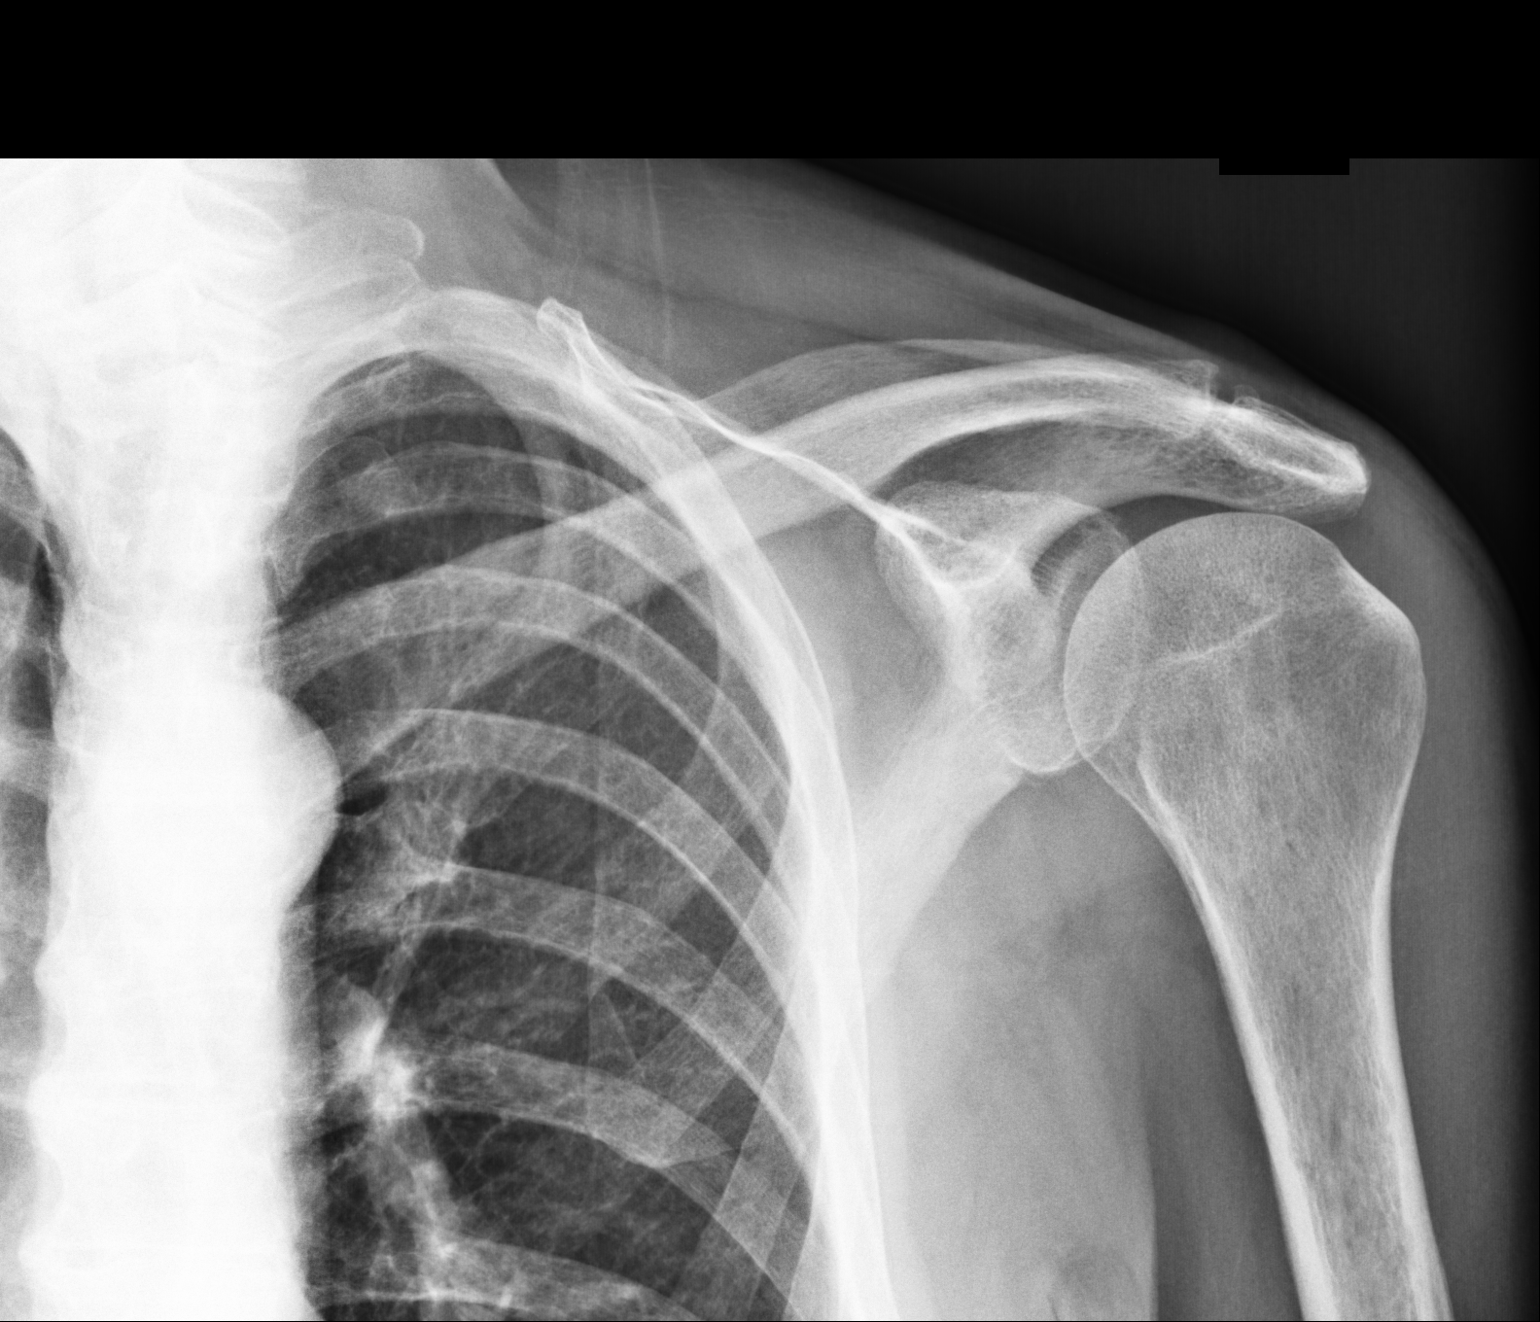
[im 3/3]
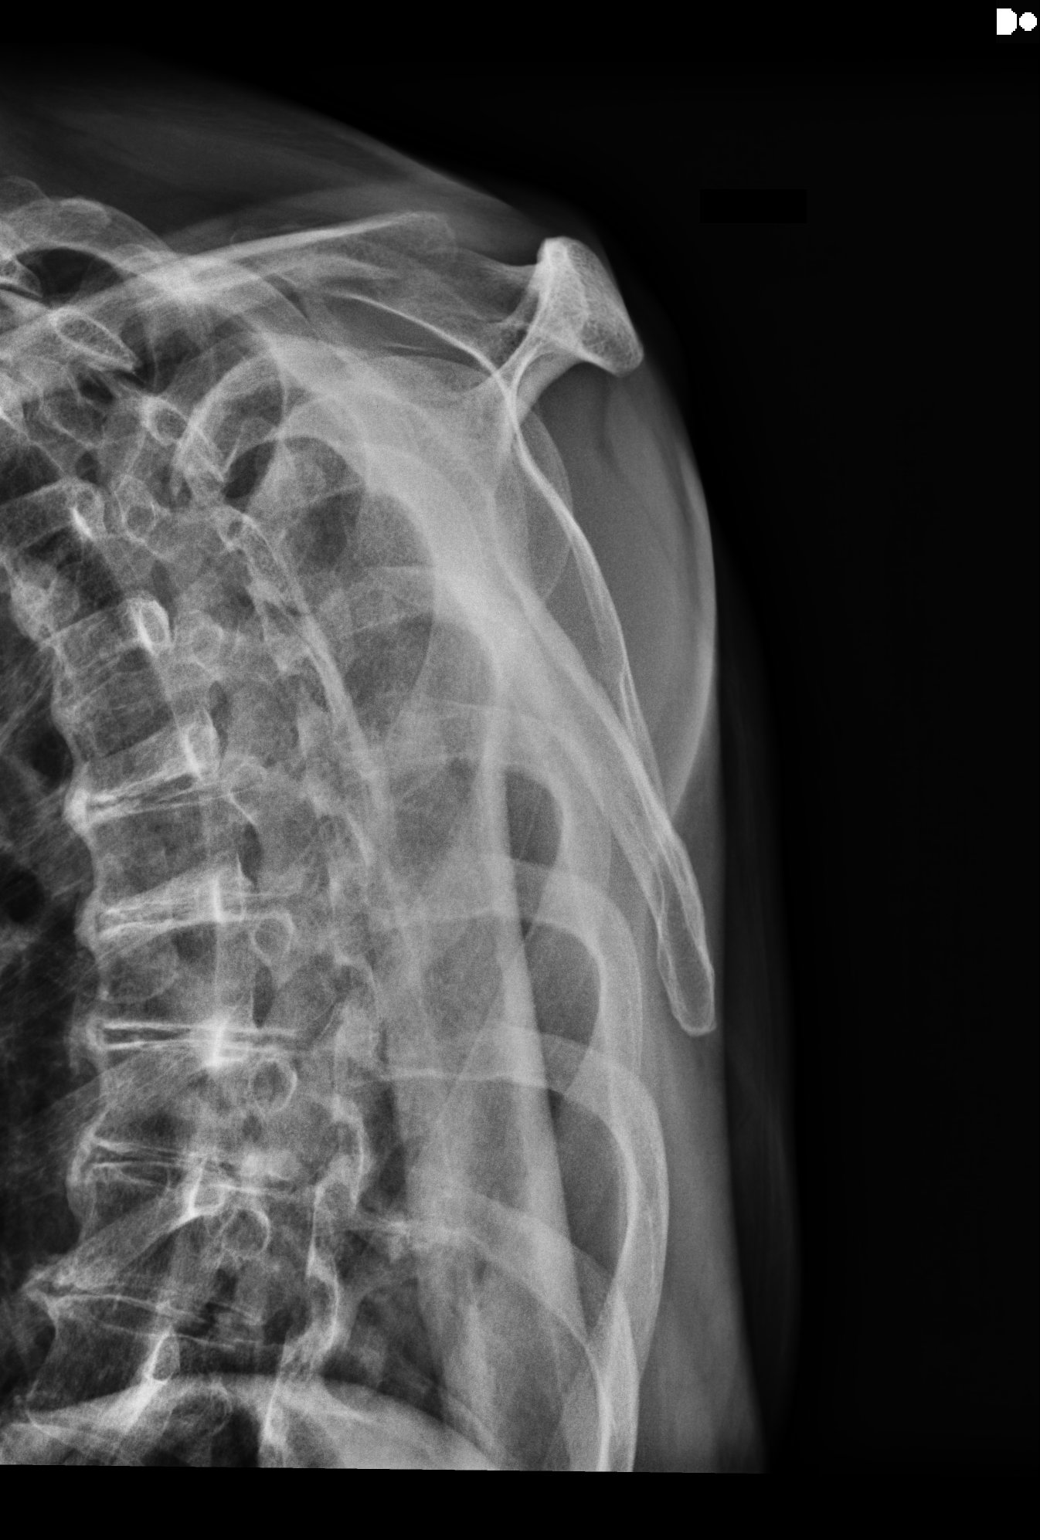

[3 of 3 positions shown; findings below may reference images not displayed]

IMPRESSION: No acute left shoulder bony abnormality evident.

[REDACTED]

## 2013-04-13 ENCOUNTER — Ambulatory Visit: Payer: Self-pay | Admitting: Urology

## 2013-04-13 LAB — PROTIME-INR: INR: 1.4

## 2013-04-25 ENCOUNTER — Emergency Department (HOSPITAL_COMMUNITY): Payer: Medicare Other

## 2013-04-25 ENCOUNTER — Inpatient Hospital Stay (HOSPITAL_COMMUNITY)
Admission: EM | Admit: 2013-04-25 | Discharge: 2013-04-28 | DRG: 812 | Disposition: A | Discharge status: Home / Self Care | Payer: Medicare Other | Attending: Family Medicine | Admitting: Family Medicine

## 2013-04-25 ENCOUNTER — Encounter (HOSPITAL_COMMUNITY): Payer: Self-pay | Admitting: Emergency Medicine

## 2013-04-25 DIAGNOSIS — R55 Syncope and collapse: Secondary | ICD-10-CM

## 2013-04-25 DIAGNOSIS — Z8509 Personal history of malignant neoplasm of other digestive organs: Secondary | ICD-10-CM

## 2013-04-25 DIAGNOSIS — K922 Gastrointestinal hemorrhage, unspecified: Secondary | ICD-10-CM

## 2013-04-25 DIAGNOSIS — N183 Chronic kidney disease, stage 3 unspecified: Secondary | ICD-10-CM | POA: Diagnosis present

## 2013-04-25 DIAGNOSIS — K219 Gastro-esophageal reflux disease without esophagitis: Secondary | ICD-10-CM | POA: Diagnosis present

## 2013-04-25 DIAGNOSIS — Z86711 Personal history of pulmonary embolism: Secondary | ICD-10-CM

## 2013-04-25 DIAGNOSIS — F3289 Other specified depressive episodes: Secondary | ICD-10-CM | POA: Diagnosis present

## 2013-04-25 DIAGNOSIS — D649 Anemia, unspecified: Secondary | ICD-10-CM

## 2013-04-25 DIAGNOSIS — I10 Essential (primary) hypertension: Secondary | ICD-10-CM

## 2013-04-25 DIAGNOSIS — I129 Hypertensive chronic kidney disease with stage 1 through stage 4 chronic kidney disease, or unspecified chronic kidney disease: Secondary | ICD-10-CM | POA: Diagnosis present

## 2013-04-25 DIAGNOSIS — Z7901 Long term (current) use of anticoagulants: Secondary | ICD-10-CM

## 2013-04-25 DIAGNOSIS — F329 Major depressive disorder, single episode, unspecified: Secondary | ICD-10-CM | POA: Diagnosis present

## 2013-04-25 DIAGNOSIS — E1129 Type 2 diabetes mellitus with other diabetic kidney complication: Secondary | ICD-10-CM

## 2013-04-25 DIAGNOSIS — E119 Type 2 diabetes mellitus without complications: Secondary | ICD-10-CM

## 2013-04-25 DIAGNOSIS — N4829 Other inflammatory disorders of penis: Secondary | ICD-10-CM | POA: Diagnosis present

## 2013-04-25 DIAGNOSIS — N4822 Cellulitis of corpus cavernosum and penis: Secondary | ICD-10-CM

## 2013-04-25 DIAGNOSIS — D371 Neoplasm of uncertain behavior of stomach: Secondary | ICD-10-CM | POA: Diagnosis present

## 2013-04-25 DIAGNOSIS — N289 Disorder of kidney and ureter, unspecified: Secondary | ICD-10-CM

## 2013-04-25 DIAGNOSIS — C259 Malignant neoplasm of pancreas, unspecified: Secondary | ICD-10-CM

## 2013-04-25 DIAGNOSIS — D62 Acute posthemorrhagic anemia: Principal | ICD-10-CM | POA: Diagnosis present

## 2013-04-25 DIAGNOSIS — D378 Neoplasm of uncertain behavior of other specified digestive organs: Secondary | ICD-10-CM | POA: Diagnosis present

## 2013-04-25 DIAGNOSIS — D500 Iron deficiency anemia secondary to blood loss (chronic): Secondary | ICD-10-CM | POA: Diagnosis present

## 2013-04-25 HISTORY — DX: Gastrointestinal hemorrhage, unspecified: K92.2

## 2013-04-25 HISTORY — DX: Unspecified cataract: H26.9

## 2013-04-25 HISTORY — DX: Cerebral cysts: G93.0

## 2013-04-25 LAB — LACTIC ACID, PLASMA: Lactic Acid, Venous: 1.1 mmol/L (ref 0.5–2.2)

## 2013-04-25 LAB — COMPREHENSIVE METABOLIC PANEL
AST: 15 U/L (ref 0–37)
Albumin: 3.9 g/dL (ref 3.5–5.2)
Calcium: 8.9 mg/dL (ref 8.4–10.5)
Creatinine, Ser: 2.36 mg/dL — ABNORMAL HIGH (ref 0.50–1.35)

## 2013-04-25 LAB — CBC WITH DIFFERENTIAL/PLATELET
Basophils Absolute: 0 10*3/uL (ref 0.0–0.1)
Basophils Relative: 0 % (ref 0–1)
Eosinophils Absolute: 0.2 10*3/uL (ref 0.0–0.7)
Eosinophils Relative: 3 % (ref 0–5)
HCT: 22.8 % — ABNORMAL LOW (ref 39.0–52.0)
MCH: 31.4 pg (ref 26.0–34.0)
MCHC: 33.8 g/dL (ref 30.0–36.0)
MCV: 93.1 fL (ref 78.0–100.0)
Monocytes Absolute: 0.6 10*3/uL (ref 0.1–1.0)
RDW: 13.2 % (ref 11.5–15.5)

## 2013-04-25 LAB — URINALYSIS, ROUTINE W REFLEX MICROSCOPIC
Bilirubin Urine: NEGATIVE
Leukocytes, UA: NEGATIVE
Nitrite: NEGATIVE
Specific Gravity, Urine: 1.01 (ref 1.005–1.030)
pH: 6 (ref 5.0–8.0)

## 2013-04-25 LAB — URINE MICROSCOPIC-ADD ON

## 2013-04-25 LAB — TROPONIN I: Troponin I: <0.3 ng/mL (ref <0.30)

## 2013-04-25 LAB — SAMPLE TO BLOOD BANK

## 2013-04-25 LAB — LIPASE, BLOOD: Lipase: 6 U/L — ABNORMAL LOW (ref 11–59)

## 2013-04-25 MED ORDER — SODIUM CHLORIDE 0.9 % IV SOLN
INTRAVENOUS | Status: DC
  Administered 2013-04-25: 21:00:00 via INTRAVENOUS

## 2013-04-25 MED ORDER — IOHEXOL 300 MG/ML  SOLN
50.0000 mL | Freq: Once | INTRAMUSCULAR | Status: AC | PRN
  Administered 2013-04-25: 50 mL via ORAL

## 2013-04-25 NOTE — ED Notes (Signed)
Patient complaining of feeling bloated to abdomen. Also reports has been having "black out" spells for approximately a week. Complains of intermittent dizziness and numbness and tingling to hands bilaterally x approximately a week. Seen at [facility] today. Blood work at family doctor today showed hemoglobin of 7.5.

## 2013-04-25 NOTE — ED Provider Notes (Signed)
History     CSN: [id number]  Arrival date & time 04/25/13  4098   First MD Initiated Contact with Patient 04/25/13 1942      Chief Complaint  Patient presents with  . Anemia  . Abdominal Pain     HPI Pt was seen at 2000.   Per pt, c/o gradual onset and persistence of constant generalized weakness and fatigue for the past 1 week. Pt states he has briefly "passed out" several times in the past week due to feeling lightheaded and generally weak. States he abd feels "bloated," but denies black or blood in stools. Endorses he was eval by his PMD at [facility] today, was told his "Hgb was 7.5," and to come to the ED for further evaluation and admission.  Denies abd pain, no N/V/D, no back pain, no CP/SOB, no fevers, no focal motor weakness, no tingling/numbness in extremities.     Past Medical History  Diagnosis Date  . Hypertension   . Diabetes mellitus, type II 2010  . Pancreatic adenocarcinoma 2011    Status post resection and chemotherapy; weight loss and malnutrition  . Depression   . Biliary obstruction 2012    Biliary stent replaced in 2012 secondary to reobstruction  . GERD (gastroesophageal reflux disease)   . Anemia   . 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 Cataracts, bilateral   . GI bleed     Past Surgical History  Procedure Laterality Date  . Pancreatic resection  2011  . Ercp w/ plastic stent placement  2012    Dr. Karilyn Cota; stent replaced  . Esophagogastroduodenoscopy  10/23/2012    Procedure: ESOPHAGOGASTRODUODENOSCOPY (EGD);  Surgeon: Malissa Hippo, MD;  Location: [facility];  Service: Endoscopy;  Laterality: N/A;  . Appendectomy    . Whipple procedure      Family History  Problem Relation Age of Onset  . Coronary artery disease Mother 43  . Heart attack Father 25  . COPD Brother   . Diabetes Mellitus I Daughter     History  Substance Use Topics  . Smoking status: Never Smoker   . Smokeless tobacco:  Never Used  . Alcohol Use: No      Review of Systems ROS: Statement: All systems negative except as marked or noted in the HPI; Constitutional: Negative for fever and chills. +generalized weakness/fatigue.; ; Eyes: Negative for eye pain, redness and discharge. ; ; ENMT: Negative for ear pain, hoarseness, nasal congestion, sinus pressure and sore throat. ; ; Cardiovascular: Negative for chest pain, palpitations, diaphoresis, dyspnea and peripheral edema. ; ; Respiratory: Negative for cough, wheezing and stridor. ; ; Gastrointestinal: +abd "bloating." Negative for nausea, vomiting, diarrhea, abdominal pain, blood in stool, hematemesis, jaundice and rectal bleeding. . ; ; Genitourinary: Negative for dysuria, flank pain and hematuria. ; ; Musculoskeletal: Negative for back pain and neck pain. Negative for swelling and trauma.; ; Skin: Negative for pruritus, rash, abrasions, blisters, bruising and skin lesion.; ; Neuro: Negative for headache and neck stiffness. Negative for altered level of consciousness , altered mental status, extremity weakness, paresthesias, involuntary movement, seizure and +lightheadeness, syncope.       Allergies  Review of patient's allergies indicates no known allergies.  Home Medications   Current Outpatient Rx  Name  Route  Sig  Dispense  Refill  . amitriptyline (ELAVIL) 10 MG tablet   Oral   Take 10 mg by mouth 2 (two) times daily.         Marland Kitchen  clonazePAM (KLONOPIN) 1 MG tablet   Oral   Take 1 mg by mouth 2 (two) times daily as needed for anxiety (and/or sleep).         . docusate sodium (COLACE) 100 MG capsule   Oral   Take 100 mg by mouth daily as needed for constipation.         . gabapentin (NEURONTIN) 100 MG capsule   Oral   Take 100 mg by mouth 2 (two) times daily.         Marland Kitchen glipiZIDE (GLUCOTROL) 5 MG tablet   Oral   Take 5 mg by mouth daily.         . megestrol (MEGACE) 40 MG tablet   Oral   Take 1 tablet (40 mg total) by mouth 2 (two)  times daily.   60 tablet   1   . metoprolol tartrate (LOPRESSOR) 25 MG tablet   Oral   Take 12.5 mg by mouth 2 (two) times daily.         . ondansetron (ZOFRAN-ODT) 8 MG disintegrating tablet   Oral   Take 8 mg by mouth every 8 (eight) hours as needed for nausea.         . Pancrelipase, Lip-Prot-Amyl, (ZENPEP) 10000 UNITS CPEP   Oral   Take 2 capsules by mouth 3 (three) times daily.         . pantoprazole (PROTONIX) 40 MG tablet   Oral   Take 1 tablet (40 mg total) by mouth daily.   30 tablet   1   . PARoxetine (PAXIL) 30 MG tablet   Oral   Take 30 mg by mouth daily.         . vitamin B-12 (CYANOCOBALAMIN) 1000 MCG tablet   Oral   Take 1,000 mcg by mouth daily.         Marland Kitchen warfarin (COUMADIN) 1 MG tablet   Oral   Take 1 mg by mouth every evening.           BP 126/68  Pulse 98  Temp(Src) 98.4 F (36.9 C) (Oral)  Resp 20  Ht 6' (1.829 m)  Wt 160 lb (72.576 kg)  BMI 21.7 kg/m2  SpO2 100%  Physical Exam 2005: Physical examination:  Nursing notes reviewed; Vital signs and O2 SAT reviewed;  Constitutional: Well developed, Well nourished, Well hydrated, In no acute distress; Head:  Normocephalic, atraumatic; Eyes: EOMI, PERRL, No scleral icterus; ENMT: Mouth and pharynx normal, Mucous membranes moist; Neck: Supple, Full range of motion, No lymphadenopathy; Cardiovascular: Regular rate and rhythm, No gallop; Respiratory: Breath sounds clear & equal bilaterally, No wheezes.  Speaking full sentences with ease, Normal respiratory effort/excursion; Chest: Nontender, Movement normal; Abdomen: Soft, Nontender, Nondistended, Normal bowel sounds. Rectal exam performed w/permission of pt and ED RN chaperone present.  Anal tone normal.  Non-tender, soft brown stool in rectal vault, heme positive.  No fissures, no external hemorrhoids, no palp masses.;; Genitourinary: No CVA tenderness; Extremities: Pulses normal, No tenderness, No edema, No calf edema or asymmetry.; Neuro:  AA&Ox3, Major CN grossly intact. No facial droop. Speech clear. No gross focal motor or sensory deficits in extremities.; Skin: Color pale, Warm, Dry.   ED Course  Procedures     MDM  MDM Reviewed: previous chart, nursing note and vitals Reviewed previous: labs and ECG Interpretation: labs, ECG, x-ray and CT scan    Date: 04/25/2013  Rate: 79  Rhythm: normal sinus rhythm  QRS Axis: left  Intervals: normal  ST/T Wave abnormalities: normal  Conduction Disutrbances:none  Narrative Interpretation: LVH  Old EKG Reviewed: changes noted; previous EKG dated 11/03/2012 was atrial flutter.    Results for orders placed during the hospital encounter of 04/25/13  URINALYSIS, ROUTINE W REFLEX MICROSCOPIC      Result Value Range   Color, Urine YELLOW  YELLOW   APPearance HAZY (*) CLEAR   Specific Gravity, Urine 1.010  1.005 - 1.030   pH 6.0  5.0 - 8.0   Glucose, UA NEGATIVE  NEGATIVE mg/dL   Hgb urine dipstick TRACE (*) NEGATIVE   Bilirubin Urine NEGATIVE  NEGATIVE   Ketones, ur NEGATIVE  NEGATIVE mg/dL   Protein, ur NEGATIVE  NEGATIVE mg/dL   Urobilinogen, UA 0.2  0.0 - 1.0 mg/dL   Nitrite NEGATIVE  NEGATIVE   Leukocytes, UA NEGATIVE  NEGATIVE  CBC WITH DIFFERENTIAL      Result Value Range   WBC 4.9  4.0 - 10.5 K/uL   RBC 2.45 (*) 4.22 - 5.81 MIL/uL   Hemoglobin 7.7 (*) 13.0 - 17.0 g/dL   HCT 47.8 (*) 29.5 - 62.1 %   MCV 93.1  78.0 - 100.0 fL   MCH 31.4  26.0 - 34.0 pg   MCHC 33.8  30.0 - 36.0 g/dL   RDW 30.8  65.7 - 84.6 %   Platelets 166  150 - 400 K/uL   Neutrophils Relative % 66  43 - 77 %   Neutro Abs 3.2  1.7 - 7.7 K/uL   Lymphocytes Relative 19  12 - 46 %   Lymphs Abs 1.0  0.7 - 4.0 K/uL   Monocytes Relative 12  3 - 12 %   Monocytes Absolute 0.6  0.1 - 1.0 K/uL   Eosinophils Relative 3  0 - 5 %   Eosinophils Absolute 0.2  0.0 - 0.7 K/uL   Basophils Relative 0  0 - 1 %   Basophils Absolute 0.0  0.0 - 0.1 K/uL  COMPREHENSIVE METABOLIC PANEL      Result Value  Range   Sodium 138  135 - 145 mEq/L   Potassium 3.5  3.5 - 5.1 mEq/L   Chloride 103  96 - 112 mEq/L   CO2 23  19 - 32 mEq/L   Glucose, Bld 89  70 - 99 mg/dL   BUN 22  6 - 23 mg/dL   Creatinine, Ser 9.62 (*) 0.50 - 1.35 mg/dL   Calcium 8.9  8.4 - 95.2 mg/dL   Total Protein 7.0  6.0 - 8.3 g/dL   Albumin 3.9  3.5 - 5.2 g/dL   AST 15  0 - 37 U/L   ALT 9  0 - 53 U/L   Alkaline Phosphatase 38 (*) 39 - 117 U/L   Total Bilirubin 0.2 (*) 0.3 - 1.2 mg/dL   GFR calc non Af Amer 28 (*) >90 mL/min   GFR calc Af Amer 32 (*) >90 mL/min  LIPASE, BLOOD      Result Value Range   Lipase 6 (*) 11 - 59 U/L  LACTIC ACID, PLASMA      Result Value Range   Lactic Acid, Venous 1.1  0.5 - 2.2 mmol/L  TROPONIN I      Result Value Range   Troponin I <0.30  <0.30 ng/mL  URINE MICROSCOPIC-ADD ON      Result Value Range   WBC, UA 0-2  <3 WBC/hpf  PROTIME-INR      Result Value Range   Prothrombin Time 17.2 (*)  11.6 - 15.2 seconds   INR 1.44  0.00 - 1.49  SAMPLE TO BLOOD BANK      Result Value Range   Blood Bank Specimen SAMPLE AVAILABLE FOR TESTING     Sample Expiration 04/28/2013    TYPE AND SCREEN      Result Value Range   ABO/RH(D) A NEG     Antibody Screen NEG     Sample Expiration 04/28/2013     Ct Abdomen Pelvis Wo Contrast 04/25/2013   *RADIOLOGY REPORT*  Clinical Data: Abdominal pain all over.  Anemia and nausea. History of pancreatic adenocarcinoma in 2001.  CT ABDOMEN AND PELVIS WITHOUT CONTRAST  Technique:  Multidetector CT imaging of the abdomen and pelvis was performed following the standard protocol without intravenous contrast.  Comparison: 10/20/2012  Findings: Emphysematous changes in the lung bases.  Coronary artery calcifications.  Thickening of the wall of the distal esophagus could represent reflux esophagitis.  Examination of the abdominal structures is limited due to lack of intravenous contrast material. There appears to be surgical resection of the head and body of the pancreas.   Pancreatic tail is not enlarged.  Pneumobilia consistent with postoperative change. No bile duct dilatation.  The unenhanced appearance of the liver, spleen, gallbladder, adrenal glands, inferior vena cava, and retroperitoneal lymph nodes is unremarkable.  Celiac axis lymph nodes and retroperitoneal lymph nodes are present but without pathologic enlargement.  Bilateral intrarenal calcifications are stable since previous study.  No evidence of obstruction.  No hydronephrosis in either kidney.  Calcification of the abdominal aorta without aneurysm.  The stomach, small bowel, and colon are not abnormally distended.  Scattered stool in the colon.  Contrast material flows to the colon without evidence of obstruction.  No free air or free fluid in the abdomen.  Abdominal wall musculature appears intact.  Pelvis:  Mild prominence of prostate gland.  Bladder wall is not thickened.  No free or loculated pelvic fluid collections.  No evidence of diverticulitis.  Appendix is not identified. Degenerative changes and mild scoliosis of the lumbar spine.  No destructive bone lesions are appreciated.  IMPRESSION: Postoperative changes in the pancreas with associated pneumobilia. Stable appearance of nonobstructing intrarenal stones bilaterally. No acute process demonstrated in the abdomen or pelvis.   Original Report Authenticated By: Burman Nieves, M.D.   Dg Chest 2 View 04/25/2013   *RADIOLOGY REPORT*  Clinical Data: Anemia.  Abdominal pain.  Abdominal distension. Syncope.  Numbness in all extremities.  CHEST - 2 VIEW  Comparison: 11/03/2012  Findings: Mild hyperinflation consistent with emphysematous change. The heart size and pulmonary vascularity are normal. The lungs appear clear and expanded without focal air space disease or consolidation. No blunting of the costophrenic angles.  No pneumothorax.  Mediastinal contours appear intact.  Calcification of the aorta.  Degenerative changes in the spine.  No significant change  since previous study.  IMPRESSION: No evidence of active pulmonary disease.   Original Report Authenticated By: Burman Nieves, M.D.   Ct Head Wo Contrast 04/25/2013   *RADIOLOGY REPORT*  Clinical Data: Headaches for several days.  Syncope.  History of hypertension, diabetes, pancreatic adenocarcinoma.  CT HEAD WITHOUT CONTRAST  Technique:  Contiguous axial images were obtained from the base of the skull through the vertex without contrast.  Comparison: 10/20/2012  Findings: Moderate cerebral atrophy.  No ventricular dilatation. Patchy low attenuation changes in the deep white matter consistent with small vessel ischemia.  Extra-axial fluid collection in the posterior fossa probably represents small arachnoid cyst.  This is stable since the previous study.  There is no mass effect or midline shift.  Gray-white matter junctions are distinct.  Basal cisterns are not effaced.  No evidence of acute intracranial hemorrhage.  Vascular calcifications.  No depressed skull fractures.  Visualized paranasal sinuses and mastoid air cells are not opacified.  Deformity of the left medial orbital wall may represent old fracture deformity.  IMPRESSION: No acute intracranial abnormalities.  Chronic atrophy and small vessel ischemic changes.  Small arachnoid cyst in the posterior fossa is stable.   Original Report Authenticated By: Burman Nieves, M.D.   Results for YUVAL, RUBENS (MRN [id number]) as of 04/25/2013 23:57  Ref. Range 10/22/2012 05:43 10/23/2012 06:25 10/24/2012 06:55 11/03/2012 10:38 04/25/2013 20:07  Hemoglobin Latest Range: 13.0-17.0 g/dL 9.4 (L) 46.9 (L) 62.9 (L) 11.0 (L) 7.7 (L)  HCT Latest Range: 39.0-52.0 % 27.6 (L) 29.2 (L) 30.1 (L) 31.9 (L) 22.8 (L)   Results for EZRIEL, BOFFA (MRN [id number]) as of 04/25/2013 23:57  Ref. Range 10/23/2012 06:25 10/24/2012 06:55 11/03/2012 10:38 04/25/2013 20:07  BUN Latest Range: 6-23 mg/dL 21 23 52 (H) 22  Creatinine Latest Range: 0.50-1.35 mg/dL 0.10 (H) 2.72 (H) 5.36 (H) 2.36  (H)    2330:  H/H lower than previous; will T&S and transfuse PRBC's. BUN/Cr per baseline. Pt is not orthostatic. Dx and testing d/w pt.  Questions answered.  Verb understanding, agreeable to admit.  T/C to [facility] Dr. Sharl Ma, case discussed, including:  HPI, pertinent PM/SHx, VS/PE, dx testing, ED course and treatment:  Agreeable to admit, requests to write temporary orders, obtain tele bed to team 1.          Laray Anger, DO 04/28/13 [phone]

## 2013-04-25 NOTE — H&P (Addendum)
PCP:   Provider Not In System   Chief Complaint:  Passed out  HPI: 62 year old male with history of pancreatic adenocarcinoma status post resection 2 years ago, diabetes mellitus, hypertension, pulmonary embolism was sent to the ED from [facility] today. Patient was found to be anemic in the primary care office with hemoglobin of 7.5. Patient says that he has been passing out over the past few days and that's why he went to the clinic for further evaluation. He denies palpitations no chest pain no shortness of breath. Admits to having headache and blurred vision. Admits to having some nausea but no vomiting.   Allergies:  No Known Allergies    Past Medical History  Diagnosis Date  . Hypertension   . Diabetes mellitus, type II 2010  . Pancreatic adenocarcinoma 2011    Status post resection and chemotherapy; weight loss and malnutrition  . Depression   . Biliary obstruction 2012    Biliary stent replaced in 2012 secondary to reobstruction  . GERD (gastroesophageal reflux disease)   . Anemia   . 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 Cataracts, bilateral   . GI bleed   . Arachnoid cyst     Past Surgical History  Procedure Laterality Date  . Pancreatic resection  2011  . Ercp w/ plastic stent placement  2012    Dr. Karilyn Cota; stent replaced  . Esophagogastroduodenoscopy  10/23/2012    Procedure: ESOPHAGOGASTRODUODENOSCOPY (EGD);  Surgeon: Malissa Hippo, MD;  Location: [facility];  Service: Endoscopy;  Laterality: N/A;  . Appendectomy    . Whipple procedure      Prior to Admission medications   Medication Sig Start Date End Date Taking? Authorizing Provider  amitriptyline (ELAVIL) 10 MG tablet Take 10 mg by mouth 2 (two) times daily.   Yes Historical Provider, MD  clonazePAM (KLONOPIN) 1 MG tablet Take 1 mg by mouth 2 (two) times daily as needed for anxiety (and/or sleep).   Yes Historical Provider, MD  docusate sodium (COLACE)  100 MG capsule Take 100 mg by mouth daily as needed for constipation.   Yes Historical Provider, MD  gabapentin (NEURONTIN) 100 MG capsule Take 100 mg by mouth 2 (two) times daily. 10/25/12  Yes Erick Blinks, MD  glipiZIDE (GLUCOTROL) 5 MG tablet Take 5 mg by mouth daily.   Yes Historical Provider, MD  megestrol (MEGACE) 40 MG tablet Take 1 tablet (40 mg total) by mouth 2 (two) times daily. 10/25/12  Yes Erick Blinks, MD  metoprolol tartrate (LOPRESSOR) 25 MG tablet Take 12.5 mg by mouth 2 (two) times daily.   Yes Historical Provider, MD  ondansetron (ZOFRAN-ODT) 8 MG disintegrating tablet Take 8 mg by mouth every 8 (eight) hours as needed for nausea.   Yes Historical Provider, MD  Pancrelipase, Lip-Prot-Amyl, (ZENPEP) 10000 UNITS CPEP Take 2 capsules by mouth 3 (three) times daily.   Yes Historical Provider, MD  pantoprazole (PROTONIX) 40 MG tablet Take 1 tablet (40 mg total) by mouth daily. 10/25/12  Yes Erick Blinks, MD  PARoxetine (PAXIL) 30 MG tablet Take 30 mg by mouth daily.   Yes Historical Provider, MD  vitamin B-12 (CYANOCOBALAMIN) 1000 MCG tablet Take 1,000 mcg by mouth daily.   Yes Historical Provider, MD  warfarin (COUMADIN) 1 MG tablet Take 1 mg by mouth every evening.   Yes Historical Provider, MD    Social History:  reports that he has never smoked. He has never used smokeless tobacco. He  reports that he does not drink alcohol or use illicit drugs.  Family History  Problem Relation Age of Onset  . Coronary artery disease Mother 65  . Heart attack Father 62  . COPD Brother   . Diabetes Mellitus I Daughter     Review of Systems:  HEENT: See history of present illness Neck: Denies thyroid problems,lymphadenopathy Chest : Denies shortness of breath, no history of COPD history of pulmonary embolism,  Heart : Denies Chest pain, no history of coronary arterey disease GI: See history of present illness GU: Denies dysuria, urgency, frequency of urination, hematuria Neuro:  Denies stroke, seizures, syncope Psych: Denies depression, anxiety, hallucinations   Physical Exam: Blood pressure 135/70, pulse 91, temperature 98.4 F (36.9 C), temperature source Oral, resp. rate 20, height 6' (1.829 m), weight 72.576 kg (160 lb), SpO2 96.00%. Constitutional:   Patient is a well-developed and well-nourished male in no acute distress and cooperative with exam. Head: Normocephalic and atraumatic Mouth: Mucus membranes moist Eyes: PERRL, EOMI, conjunctivae normal Neck: Supple, No Thyromegaly Cardiovascular: RRR, S1 normal, S2 normal Pulmonary/Chest: CTAB, no wheezes, rales, or rhonchi Abdominal: Soft.  mild epigastric tenderness ,  mild abdominal distention  bowel sounds are normal, no masses, organomegaly, or guarding present.  Penis: partial circumcision, with sutures in place. Positive erythema and edema of the foreskin Neurological: A&O x3, Strenght is normal and symmetric bilaterally, cranial nerve II-XII are grossly intact, no focal motor deficit, sensory intact to light touch bilaterally.  Extremities : No Cyanosis, Clubbing or Edema   Labs on Admission:  Results for orders placed during the hospital encounter of 04/25/13 (from the past 48 hour(s))  PROTIME-INR     Status: Abnormal   Collection Time    04/25/13  8:00 PM      Result Value Range   Prothrombin Time 17.2 (*) 11.6 - 15.2 seconds   INR 1.44  0.00 - 1.49  CBC WITH DIFFERENTIAL     Status: Abnormal   Collection Time    04/25/13  8:07 PM      Result Value Range   WBC 4.9  4.0 - 10.5 K/uL   RBC 2.45 (*) 4.22 - 5.81 MIL/uL   Hemoglobin 7.7 (*) 13.0 - 17.0 g/dL   HCT 16.1 (*) 09.6 - 04.5 %   MCV 93.1  78.0 - 100.0 fL   MCH 31.4  26.0 - 34.0 pg   MCHC 33.8  30.0 - 36.0 g/dL   RDW 40.9  81.1 - 91.4 %   Platelets 166  150 - 400 K/uL   Neutrophils Relative % 66  43 - 77 %   Neutro Abs 3.2  1.7 - 7.7 K/uL   Lymphocytes Relative 19  12 - 46 %   Lymphs Abs 1.0  0.7 - 4.0 K/uL   Monocytes Relative 12   3 - 12 %   Monocytes Absolute 0.6  0.1 - 1.0 K/uL   Eosinophils Relative 3  0 - 5 %   Eosinophils Absolute 0.2  0.0 - 0.7 K/uL   Basophils Relative 0  0 - 1 %   Basophils Absolute 0.0  0.0 - 0.1 K/uL  COMPREHENSIVE METABOLIC PANEL     Status: Abnormal   Collection Time    04/25/13  8:07 PM      Result Value Range   Sodium 138  135 - 145 mEq/L   Potassium 3.5  3.5 - 5.1 mEq/L   Chloride 103  96 - 112 mEq/L   CO2 23  19 - 32 mEq/L   Glucose, Bld 89  70 - 99 mg/dL   BUN 22  6 - 23 mg/dL   Creatinine, Ser 1.61 (*) 0.50 - 1.35 mg/dL   Calcium 8.9  8.4 - 09.6 mg/dL   Total Protein 7.0  6.0 - 8.3 g/dL   Albumin 3.9  3.5 - 5.2 g/dL   AST 15  0 - 37 U/L   ALT 9  0 - 53 U/L   Alkaline Phosphatase 38 (*) 39 - 117 U/L   Total Bilirubin 0.2 (*) 0.3 - 1.2 mg/dL   GFR calc non Af Amer 28 (*) >90 mL/min   GFR calc Af Amer 32 (*) >90 mL/min   Comment:            The eGFR has been calculated     using the CKD EPI equation.     This calculation has not been     validated in all clinical     situations.     eGFR's persistently     <90 mL/min signify     possible Chronic Kidney Disease.  LIPASE, BLOOD     Status: Abnormal   Collection Time    04/25/13  8:07 PM      Result Value Range   Lipase 6 (*) 11 - 59 U/L  LACTIC ACID, PLASMA     Status: None   Collection Time    04/25/13  8:07 PM      Result Value Range   Lactic Acid, Venous 1.1  0.5 - 2.2 mmol/L  TROPONIN I     Status: None   Collection Time    04/25/13  8:07 PM      Result Value Range   Troponin I <0.30  <0.30 ng/mL   Comment:            Due to the release kinetics of cTnI,     a negative result within the first hours     of the onset of symptoms does not rule out     myocardial infarction with certainty.     If myocardial infarction is still suspected,     repeat the test at appropriate intervals.  SAMPLE TO BLOOD BANK     Status: None   Collection Time    04/25/13  8:08 PM      Result Value Range   Blood Bank  Specimen SAMPLE AVAILABLE FOR TESTING     Sample Expiration 04/28/2013    URINALYSIS, ROUTINE W REFLEX MICROSCOPIC     Status: Abnormal   Collection Time    04/25/13 10:38 PM      Result Value Range   Color, Urine YELLOW  YELLOW   APPearance HAZY (*) CLEAR   Specific Gravity, Urine 1.010  1.005 - 1.030   pH 6.0  5.0 - 8.0   Glucose, UA NEGATIVE  NEGATIVE mg/dL   Hgb urine dipstick TRACE (*) NEGATIVE   Bilirubin Urine NEGATIVE  NEGATIVE   Ketones, ur NEGATIVE  NEGATIVE mg/dL   Protein, ur NEGATIVE  NEGATIVE mg/dL   Urobilinogen, UA 0.2  0.0 - 1.0 mg/dL   Nitrite NEGATIVE  NEGATIVE   Leukocytes, UA NEGATIVE  NEGATIVE  URINE MICROSCOPIC-ADD ON     Status: None   Collection Time    04/25/13 10:38 PM      Result Value Range   WBC, UA 0-2  <3 WBC/hpf  TYPE AND SCREEN     Status: None   Collection Time  04/25/13 10:47 PM      Result Value Range   ABO/RH(D) A NEG     Antibody Screen NEG     Sample Expiration 04/28/2013      Radiological Exams on Admission: Ct Abdomen Pelvis Wo Contrast  04/25/2013   *RADIOLOGY REPORT*  Clinical Data: Abdominal pain all over.  Anemia and nausea. History of pancreatic adenocarcinoma in 2001.  CT ABDOMEN AND PELVIS WITHOUT CONTRAST  Technique:  Multidetector CT imaging of the abdomen and pelvis was performed following the standard protocol without intravenous contrast.  Comparison: 10/20/2012  Findings: Emphysematous changes in the lung bases.  Coronary artery calcifications.  Thickening of the wall of the distal esophagus could represent reflux esophagitis.  Examination of the abdominal structures is limited due to lack of intravenous contrast material. There appears to be surgical resection of the head and body of the pancreas.  Pancreatic tail is not enlarged.  Pneumobilia consistent with postoperative change. No bile duct dilatation.  The unenhanced appearance of the liver, spleen, gallbladder, adrenal glands, inferior vena cava, and retroperitoneal lymph  nodes is unremarkable.  Celiac axis lymph nodes and retroperitoneal lymph nodes are present but without pathologic enlargement.  Bilateral intrarenal calcifications are stable since previous study.  No evidence of obstruction.  No hydronephrosis in either kidney.  Calcification of the abdominal aorta without aneurysm.  The stomach, small bowel, and colon are not abnormally distended.  Scattered stool in the colon.  Contrast material flows to the colon without evidence of obstruction.  No free air or free fluid in the abdomen.  Abdominal wall musculature appears intact.  Pelvis:  Mild prominence of prostate gland.  Bladder wall is not thickened.  No free or loculated pelvic fluid collections.  No evidence of diverticulitis.  Appendix is not identified. Degenerative changes and mild scoliosis of the lumbar spine.  No destructive bone lesions are appreciated.  IMPRESSION: Postoperative changes in the pancreas with associated pneumobilia. Stable appearance of nonobstructing intrarenal stones bilaterally. No acute process demonstrated in the abdomen or pelvis.   Original Report Authenticated By: Burman Nieves, M.D.   Dg Chest 2 View  04/25/2013   *RADIOLOGY REPORT*  Clinical Data: Anemia.  Abdominal pain.  Abdominal distension. Syncope.  Numbness in all extremities.  CHEST - 2 VIEW  Comparison: 11/03/2012  Findings: Mild hyperinflation consistent with emphysematous change. The heart size and pulmonary vascularity are normal. The lungs appear clear and expanded without focal air space disease or consolidation. No blunting of the costophrenic angles.  No pneumothorax.  Mediastinal contours appear intact.  Calcification of the aorta.  Degenerative changes in the spine.  No significant change since previous study.  IMPRESSION: No evidence of active pulmonary disease.   Original Report Authenticated By: Burman Nieves, M.D.   Ct Head Wo Contrast  04/25/2013   *RADIOLOGY REPORT*  Clinical Data: Headaches for several  days.  Syncope.  History of hypertension, diabetes, pancreatic adenocarcinoma.  CT HEAD WITHOUT CONTRAST  Technique:  Contiguous axial images were obtained from the base of the skull through the vertex without contrast.  Comparison: 10/20/2012  Findings: Moderate cerebral atrophy.  No ventricular dilatation. Patchy low attenuation changes in the deep white matter consistent with small vessel ischemia.  Extra-axial fluid collection in the posterior fossa probably represents small arachnoid cyst.  This is stable since the previous study.  There is no mass effect or midline shift.  Gray-white matter junctions are distinct.  Basal cisterns are not effaced.  No evidence of acute intracranial hemorrhage.  Vascular calcifications.  No depressed skull fractures.  Visualized paranasal sinuses and mastoid air cells are not opacified.  Deformity of the left medial orbital wall may represent old fracture deformity.  IMPRESSION: No acute intracranial abnormalities.  Chronic atrophy and small vessel ischemic changes.  Small arachnoid cyst in the posterior fossa is stable.   Original Report Authenticated By: Burman Nieves, M.D.    Assessment/Plan Active Problems:   Anemia   Hypertension   Diabetes mellitus, type II   Pancreatic adenocarcinoma   Syncope   Syncope Patient is having recurrent episodes of syncope. I doubt anemia is the only cause of syncope, will obtain 2-D echocardiogram. 3 sets of cardiac enzymes. Patient's EKG from December 2013 showed atrial flutter. Today's EKG shows normal sinus rhythm. He'll need cardiology evaluation for syncope Will check orthostatics every shift  Anemia Patient has guaiac-positive stools in the ED Will continue to order the hemoglobin hematocrit He will need upper GI endoscopy Consider GI consult  Diabetes mellitus We'll start sliding scale insulin Hold oral hypoglycemic agents at this time  History of pancreatic adenocarcinoma Patient is status post surgery and  chemotherapy He takes pancreatic enzymes, which will be continued in the hospital  Anticoagulation As per patient he takes warfarin for history of pulmonary embolism  Patient currently takes warfarin, his INR is subtherapeutic We'll continue to hold the Coumadin  Penile cellulitis Patient recently had partial circumcision, and did not take off dressing in two days, and now has purulent discharge and erythema of the penile foreskin. Will do local antibiotics and start on vancomycin and zosyn, consider urology consult in am.  Code status:Full code   Family discussion Discussed with patient in detail, the family at bedside   Time Spent on Admission: 75 min  [facility] Pager: [phone] 04/25/2013, 11:57 PM

## 2013-04-26 DIAGNOSIS — N058 Unspecified nephritic syndrome with other morphologic changes: Secondary | ICD-10-CM

## 2013-04-26 DIAGNOSIS — N4829 Other inflammatory disorders of penis: Secondary | ICD-10-CM

## 2013-04-26 DIAGNOSIS — N4822 Cellulitis of corpus cavernosum and penis: Secondary | ICD-10-CM | POA: Diagnosis present

## 2013-04-26 DIAGNOSIS — D649 Anemia, unspecified: Secondary | ICD-10-CM

## 2013-04-26 DIAGNOSIS — C259 Malignant neoplasm of pancreas, unspecified: Secondary | ICD-10-CM

## 2013-04-26 DIAGNOSIS — E1129 Type 2 diabetes mellitus with other diabetic kidney complication: Secondary | ICD-10-CM

## 2013-04-26 DIAGNOSIS — I517 Cardiomegaly: Secondary | ICD-10-CM

## 2013-04-26 DIAGNOSIS — K921 Melena: Secondary | ICD-10-CM

## 2013-04-26 LAB — COMPREHENSIVE METABOLIC PANEL
ALT: 8 U/L (ref 0–53)
Albumin: 3.4 g/dL — ABNORMAL LOW (ref 3.5–5.2)
Alkaline Phosphatase: 34 U/L — ABNORMAL LOW (ref 39–117)
BUN: 18 mg/dL (ref 6–23)
Chloride: 108 mEq/L (ref 96–112)
GFR calc Af Amer: 40 mL/min — ABNORMAL LOW (ref >90)
Glucose, Bld: 100 mg/dL — ABNORMAL HIGH (ref 70–99)
Potassium: 3.6 mEq/L (ref 3.5–5.1)
Sodium: 142 mEq/L (ref 135–145)
Total Bilirubin: 1.6 mg/dL — ABNORMAL HIGH (ref 0.3–1.2)

## 2013-04-26 LAB — CBC
HCT: 24.1 % — ABNORMAL LOW (ref 39.0–52.0)
Hemoglobin: 8.3 g/dL — ABNORMAL LOW (ref 13.0–17.0)
RBC: 2.61 MIL/uL — ABNORMAL LOW (ref 4.22–5.81)
WBC: 3.6 10*3/uL — ABNORMAL LOW (ref 4.0–10.5)

## 2013-04-26 LAB — PROTIME-INR
Prothrombin Time: 17.6 seconds — ABNORMAL HIGH (ref 11.6–15.2)
Prothrombin Time: 15.6 seconds — ABNORMAL HIGH (ref 11.6–15.2)

## 2013-04-26 LAB — URINE CULTURE

## 2013-04-26 LAB — TROPONIN I: Troponin I: <0.3 ng/mL (ref <0.30)

## 2013-04-26 MED ORDER — GABAPENTIN 100 MG PO CAPS
100.0000 mg | ORAL_CAPSULE | Freq: Two times a day (BID) | ORAL | Status: DC
  Administered 2013-04-26 – 2013-04-28 (×6): 100 mg via ORAL
  Filled 2013-04-26 (×6): qty 1

## 2013-04-26 MED ORDER — SODIUM CHLORIDE 0.9 % IJ SOLN
3.0000 mL | Freq: Two times a day (BID) | INTRAMUSCULAR | Status: DC
  Administered 2013-04-26 – 2013-04-28 (×6): 3 mL via INTRAVENOUS
  Filled 2013-04-26: qty 3

## 2013-04-26 MED ORDER — ONDANSETRON HCL 4 MG/2ML IJ SOLN: 4.0000 mg | Freq: Four times a day (QID) | INTRAMUSCULAR | Status: DC | PRN

## 2013-04-26 MED ORDER — PEG 3350-KCL-NABCB-NACL-NASULF 236 G PO SOLR
4000.0000 mL | Freq: Once | ORAL | Status: AC
  Administered 2013-04-26: 4000 mL via ORAL
  Filled 2013-04-26: qty 4000

## 2013-04-26 MED ORDER — PAROXETINE HCL 20 MG PO TABS
30.0000 mg | ORAL_TABLET | Freq: Every day | ORAL | Status: DC
  Administered 2013-04-26 – 2013-04-28 (×3): 30 mg via ORAL
  Filled 2013-04-26 (×3): qty 2

## 2013-04-26 MED ORDER — INSULIN ASPART 100 UNIT/ML ~~LOC~~ SOLN
0.0000 [IU] | Freq: Three times a day (TID) | SUBCUTANEOUS | Status: DC
  Administered 2013-04-26: 2 [IU] via SUBCUTANEOUS

## 2013-04-26 MED ORDER — PANCRELIPASE (LIP-PROT-AMYL) 12000-38000 UNITS PO CPEP
2.0000 | ORAL_CAPSULE | Freq: Three times a day (TID) | ORAL | Status: DC
  Administered 2013-04-26 – 2013-04-28 (×7): 2 via ORAL
  Filled 2013-04-26 (×8): qty 2

## 2013-04-26 MED ORDER — MUPIROCIN CALCIUM 2 % EX CREA
TOPICAL_CREAM | Freq: Two times a day (BID) | CUTANEOUS | Status: DC
  Filled 2013-04-26: qty 15

## 2013-04-26 MED ORDER — PIPERACILLIN-TAZOBACTAM 3.375 G IVPB 30 MIN: 3.3750 g | Freq: Four times a day (QID) | INTRAVENOUS | Status: DC

## 2013-04-26 MED ORDER — PIPERACILLIN-TAZOBACTAM 3.375 G IVPB
INTRAVENOUS | Status: AC
  Filled 2013-04-26: qty 50

## 2013-04-26 MED ORDER — AMITRIPTYLINE HCL 10 MG PO TABS
10.0000 mg | ORAL_TABLET | Freq: Two times a day (BID) | ORAL | Status: DC
  Administered 2013-04-26 – 2013-04-28 (×6): 10 mg via ORAL
  Filled 2013-04-26 (×6): qty 1

## 2013-04-26 MED ORDER — METOPROLOL TARTRATE 25 MG PO TABS
12.5000 mg | ORAL_TABLET | Freq: Two times a day (BID) | ORAL | Status: DC
  Administered 2013-04-26 – 2013-04-28 (×6): 12.5 mg via ORAL
  Filled 2013-04-26 (×6): qty 1

## 2013-04-26 MED ORDER — ONDANSETRON HCL 4 MG PO TABS: 4.0000 mg | ORAL_TABLET | Freq: Four times a day (QID) | ORAL | Status: DC | PRN

## 2013-04-26 MED ORDER — PIPERACILLIN-TAZOBACTAM 3.375 G IVPB
3.3750 g | Freq: Three times a day (TID) | INTRAVENOUS | Status: DC
  Administered 2013-04-26 – 2013-04-27 (×4): 3.375 g via INTRAVENOUS
  Filled 2013-04-26 (×5): qty 50

## 2013-04-26 MED ORDER — DEXTROSE-NACL 5-0.9 % IV SOLN
INTRAVENOUS | Status: DC
  Administered 2013-04-26 – 2013-04-27 (×2): via INTRAVENOUS

## 2013-04-26 MED ORDER — VANCOMYCIN HCL IN DEXTROSE 1-5 GM/200ML-% IV SOLN
1000.0000 mg | INTRAVENOUS | Status: DC
  Administered 2013-04-26 – 2013-04-27 (×2): 1000 mg via INTRAVENOUS
  Filled 2013-04-26 (×2): qty 200

## 2013-04-26 MED ORDER — VANCOMYCIN HCL IN DEXTROSE 1-5 GM/200ML-% IV SOLN
INTRAVENOUS | Status: AC
  Filled 2013-04-26: qty 200

## 2013-04-26 MED ORDER — MUPIROCIN 2 % EX OINT
1.0000 "application " | TOPICAL_OINTMENT | Freq: Two times a day (BID) | CUTANEOUS | Status: DC
  Administered 2013-04-26 – 2013-04-28 (×5): 1 via TOPICAL
  Filled 2013-04-26: qty 22

## 2013-04-26 MED ORDER — CHLORHEXIDINE GLUCONATE CLOTH 2 % EX PADS
6.0000 | MEDICATED_PAD | Freq: Every day | CUTANEOUS | Status: DC
  Administered 2013-04-27 – 2013-04-28 (×2): 6 via TOPICAL

## 2013-04-26 MED ORDER — MUPIROCIN 2 % EX OINT
1.0000 "application " | TOPICAL_OINTMENT | Freq: Two times a day (BID) | CUTANEOUS | Status: DC
  Filled 2013-04-26: qty 22

## 2013-04-26 MED ORDER — MUPIROCIN 2 % EX OINT
1.0000 "application " | TOPICAL_OINTMENT | Freq: Two times a day (BID) | CUTANEOUS | Status: DC
  Administered 2013-04-26 – 2013-04-28 (×5): 1 via NASAL

## 2013-04-26 MED ORDER — CLONAZEPAM 0.5 MG PO TABS
1.0000 mg | ORAL_TABLET | Freq: Two times a day (BID) | ORAL | Status: DC | PRN
Start: 2013-04-26 — End: 2013-04-28
  Administered 2013-04-26 – 2013-04-27 (×3): 1 mg via ORAL
  Filled 2013-04-26 (×2): qty 2
  Filled 2013-04-26: qty 1
  Filled 2013-04-26: qty 2
  Filled 2013-04-26: qty 1

## 2013-04-26 NOTE — Progress Notes (Signed)
*  PRELIMINARY RESULTS* Echocardiogram 2D Echocardiogram has been performed.  Conrad [place] 04/26/2013, 2:30 PM

## 2013-04-26 NOTE — Progress Notes (Signed)
Notified Dr. Chancy Milroy that pt refuses to wear SCD's.

## 2013-04-26 NOTE — Care Management Note (Addendum)
    Page 1 of 1   04/28/2013     2:34:11 PM   CARE MANAGEMENT NOTE 04/28/2013  Patient:  [name], [name]   Account Number:  [id number]  Date Initiated:  04/26/2013  Documentation initiated by:  Anibal Henderson  Subjective/Objective Assessment:   admitted with a G.I. bleed, with syncope. History of adenocarcinoma of the pancreas.  Will have colonoscopy and EGD tomorrow. Patient lives at home with spouse, and will be returning home at discharge     Action/Plan:   no case management needs identified   Anticipated DC Date:  04/27/2013   Anticipated DC Plan:  HOME/SELF CARE      DC Planning Services  CM consult      Choice offered to / List presented to:             Status of service:  Completed, signed off Medicare Important Message given?  YES (If response is "NO", the following Medicare IM given date fields will be blank) Date Medicare IM given:  04/28/2013 Date Additional Medicare IM given:    Discharge Disposition:  HOME W HOME HEALTH SERVICES  Per UR Regulation:  Reviewed for med. necessity/level of care/duration of stay  If discussed at Long Length of Stay Meetings, dates discussed:    Comments:  04/28/13 1430 Arlyss Queen, RN BSN CM Pt potential discharge today. Pt requested help with medications and CM explained that we would not be able to help him. CM did leave a list of his chronic medications that he can get at [facility] for $4. CM also left websites that pt could look up for medication assistance if he has internet access. No other CM needs noted.  04/26/13 1040 Anibal Henderson RN/CM

## 2013-04-26 NOTE — Progress Notes (Signed)
UR Chart Review Completed  

## 2013-04-26 NOTE — Progress Notes (Signed)
[facility] PROGRESS NOTE  [name] ZOX:[id number] DOB: Jul 13, 1951 DOA: 04/25/2013 [facility]: [facility]  Assessment/Plan: Active Problems:   Anemia: Appreciate gastroenterology followup. For colonoscopy and EGD tomorrow. Continue to monitor.   Hypertension: Blood pressure stable   Diabetes mellitus, type II controlled with renal manifestations: Sliding scale only while n.p.o.    Pancreatic adenocarcinoma   Syncope: Etiology unclear. May be combination of anemia from possible GI bleed in addition to by mouth infection. Continue on telemetry. Continue IV fluids. Monitor closely. CK D. Stage III: Looks to be stable  Penile cellulitis: Continue Zosyn and vancomycin. Have requested urology to see.   Code Status: Full code Family Communication: Patient declined when asked if there is any when I should call to get an update Disposition Plan: Home in the next few days   Consultants:  Dr.Rehman: Gastroenterology  Urology consult requested  Procedures:  Colonoscopy/EGD planned for 6/11  Antibiotics:  Day 2: Vancomycin and Zosyn  HPI/Subjective: Patient doing okay this morning. Denies any dizziness, headaches. Denies any palpitations or shortness of breath. His hungry.  Objective: Filed Vitals:   04/26/13 0700 04/26/13 0757 04/26/13 0800 04/26/13 0801  BP: 127/71 140/80 138/86 122/72  Pulse: 72 78 82 88  Temp:  98.8 F (37.1 C)    TempSrc:  Oral    Resp: 12 13 21 13   Height:      Weight:      SpO2: 98% 100% 98% 100%    Intake/Output Summary (Last 24 hours) at 04/26/13 1055 Last data filed at 04/26/13 0700  Gross per 24 hour  Intake 1208.83 ml  Output   1175 ml  Net  33.83 ml   Filed Weights   04/25/13 1929 04/26/13 0106 04/26/13 0500  Weight: 72.576 kg (160 lb) 74.9 kg (165 lb 2 oz) 76.8 kg (169 lb 5 oz)    Exam:   General:  Alert and oriented x3, no acute distress, hungry  Cardiovascular: Regular rate and rhythm,  S1-S2  Respiratory: Clear to auscultation bilaterally  Abdomen: Soft, mild distention, nontender, hypoactive bowel sounds  Musculoskeletal: No clubbing or cyanosis, trace pitting edema   Data Reviewed: Basic Metabolic Panel:  Recent Labs Lab 04/25/13 2007 04/26/13 0131 04/26/13 0716  NA 138  --  142  K 3.5  --  3.6  CL 103  --  108  CO2 23  --  23  GLUCOSE 89  --  100*  BUN 22  --  18  CREATININE 2.36*  --  1.98*  CALCIUM 8.9  --  8.9  MG  --  1.7  --    Liver Function Tests:  Recent Labs Lab 04/25/13 2007 04/26/13 0716  AST 15 14  ALT 9 8  ALKPHOS 38* 34*  BILITOT 0.2* 1.6*  PROT 7.0 6.1  ALBUMIN 3.9 3.4*    Recent Labs Lab 04/25/13 2007  LIPASE 6*   CBC:  Recent Labs Lab 04/25/13 2007 04/26/13 0716  WBC 4.9 3.6*  NEUTROABS 3.2  --   HGB 7.7* 8.3*  HCT 22.8* 24.1*  MCV 93.1 92.3  PLT 166 159   Cardiac Enzymes:  Recent Labs Lab 04/25/13 2007 04/26/13 0131 04/26/13 0716  TROPONINI <0.30 <0.30 <0.30     Recent Results (from the past 240 hour(s))  MRSA PCR SCREENING     Status: Abnormal   Collection Time    04/26/13  1:00 AM      Result Value Range Status   MRSA by PCR POSITIVE (*)  NEGATIVE Final   Comment:            The GeneXpert MRSA Assay (FDA     approved for NASAL specimens     only), is one component of a     comprehensive MRSA colonization     surveillance program. It is not     intended to diagnose MRSA     infection nor to guide or     monitor treatment for     MRSA infections.     RESULT CALLED TO, READ BACK BY AND VERIFIED WITH:     Roger Kill N AT 0300 ON [id number] BY FORSYTH K     Studies: Ct Abdomen Pelvis Wo Contrast  04/25/2013     IMPRESSION: Postoperative changes in the pancreas with associated pneumobilia. Stable appearance of nonobstructing intrarenal stones bilaterally. No acute process demonstrated in the abdomen or pelvis.   Original Report Authenticated By: Burman Nieves, M.D.   Dg Chest 2 View  04/25/2013     IMPRESSION: No evidence of active pulmonary disease.   Original Report Authenticated By: Burman Nieves, M.D.   Ct Head Wo Contrast  04/25/2013     IMPRESSION: No acute intracranial abnormalities.  Chronic atrophy and small vessel ischemic changes.  Small arachnoid cyst in the posterior fossa is stable.   Original Report Authenticated By: Burman Nieves, M.D.    Scheduled Meds: . amitriptyline  10 mg Oral BID  . [START ON 04/27/2013] Chlorhexidine Gluconate Cloth  6 each Topical Q0600  . gabapentin  100 mg Oral BID  . insulin aspart  0-15 Units Subcutaneous TID WC  . lipase/protease/amylase  2 capsule Oral TID WC  . metoprolol tartrate  12.5 mg Oral BID  . mupirocin ointment  1 application Nasal BID  . mupirocin ointment  1 application Topical BID  . PARoxetine  30 mg Oral Daily  . piperacillin-tazobactam (ZOSYN)  IV  3.375 g Intravenous Q8H  . polyethylene glycol  4,000 mL Oral Once  . sodium chloride  3 mL Intravenous Q12H  . vancomycin  1,000 mg Intravenous Q24H   Continuous Infusions: . sodium chloride 75 mL/hr at 04/25/13 2038    Active Problems:   Anemia   Hypertension   Diabetes mellitus, type II   Pancreatic adenocarcinoma   Syncope    Time spent: 20 min    Hollice Espy  [facility] Pager [phone]. If 7PM-7AM, please contact night-coverage at [email or web address], password [facility] 04/26/2013, 10:55 AM  LOS: 1 day

## 2013-04-26 NOTE — Consult Note (Signed)
Note #[id number]

## 2013-04-26 NOTE — Consult Note (Signed)
Reason for Consult:anemia Referring Physician: Hospitalist  [name] is an 62 y.o. male.     Admitted thru the ED last night. He presented to [facility] and hemoglobin was noted to be 7.5. He was advised to come to the ED. He had been feeling weak for a couple of weeks. He had also had syncopal episodes. He denies seeing any rectal bleeding. He recently noticed he had black stool. Hx of pancreatic carcinoma and underwent pancreatic resection at [facility].in  2012. Hx of anemia since his diagnosis of pancreatic carcinoma.  He sees Dr. [name] at [facility] for follow ups. He has received one unit of PRBCs.  Hemoglobin today is 8.3. In the ED his stool was brown and guaiac positive.  His appetite has been okay. He says he has gained weight. He recently had a partial circumcision in [place] 2 weeks ago.   EGD 10/2012: Impression:  Small sliding hiatal hernia.  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  Patent pylorus with wide-open anastomosis and normal-appearing afferent and efferent jejunal loops.    Last colonoscopy was 3 yrs ago at [facility] and he reports that it was not complete.   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 Hypertension   . Diabetes mellitus, type II 2010  . Pancreatic adenocarcinoma 2011    Status post resection and chemotherapy; weight loss and malnutrition  . Depression   . Biliary obstruction 2012    Biliary stent replaced in 2012 secondary to reobstruction  . GERD (gastroesophageal reflux disease)   . Anemia   . 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 Cataracts, bilateral   . GI bleed   . Arachnoid cyst     Past Surgical History  Procedure Laterality Date  . Pancreatic resection  2011  . Ercp w/ plastic stent placement  2012    Dr. Karilyn Cota; stent replaced  . Esophagogastroduodenoscopy  10/23/2012    Procedure:  ESOPHAGOGASTRODUODENOSCOPY (EGD);  Surgeon: Malissa Hippo, MD;  Location: [facility];  Service: Endoscopy;  Laterality: N/A;  . Appendectomy    . Whipple procedure      Family History  Problem Relation Age of Onset  . Coronary artery disease Mother 28  . Heart attack Father 40  . COPD Brother   . Diabetes Mellitus I Daughter     Social History:  reports that he has never smoked. He has never used smokeless tobacco. He reports that he does not drink alcohol or use illicit drugs.  Allergies: No Known Allergies  Medications: I have reviewed the patient's current medications.  Results for orders placed during the hospital encounter of 04/25/13 (from the past 48 hour(s))  PROTIME-INR     Status: Abnormal   Collection Time    04/25/13  8:00 PM      Result Value Range   Prothrombin Time 17.2 (*) 11.6 - 15.2 seconds   INR 1.44  0.00 - 1.49  CBC WITH DIFFERENTIAL     Status: Abnormal   Collection Time    04/25/13  8:07 PM      Result Value Range   WBC 4.9  4.0 - 10.5 K/uL   RBC 2.45 (*) 4.22 - 5.81 MIL/uL   Hemoglobin 7.7 (*) 13.0 - 17.0 g/dL   HCT 16.1 (*) 09.6 - 04.5 %   MCV 93.1  78.0 - 100.0 fL   MCH 31.4  26.0 - 34.0 pg   MCHC 33.8  30.0 - 36.0 g/dL   RDW 16.1  09.6 - 04.5 %   Platelets 166  150 - 400 K/uL   Neutrophils Relative % 66  43 - 77 %   Neutro Abs 3.2  1.7 - 7.7 K/uL   Lymphocytes Relative 19  12 - 46 %   Lymphs Abs 1.0  0.7 - 4.0 K/uL   Monocytes Relative 12  3 - 12 %   Monocytes Absolute 0.6  0.1 - 1.0 K/uL   Eosinophils Relative 3  0 - 5 %   Eosinophils Absolute 0.2  0.0 - 0.7 K/uL   Basophils Relative 0  0 - 1 %   Basophils Absolute 0.0  0.0 - 0.1 K/uL  COMPREHENSIVE METABOLIC PANEL     Status: Abnormal   Collection Time    04/25/13  8:07 PM      Result Value Range   Sodium 138  135 - 145 mEq/L   Potassium 3.5  3.5 - 5.1 mEq/L   Chloride 103  96 - 112 mEq/L   CO2 23  19 - 32 mEq/L   Glucose, Bld 89  70 - 99 mg/dL   BUN 22  6 - 23 mg/dL    Creatinine, Ser 4.09 (*) 0.50 - 1.35 mg/dL   Calcium 8.9  8.4 - 81.1 mg/dL   Total Protein 7.0  6.0 - 8.3 g/dL   Albumin 3.9  3.5 - 5.2 g/dL   AST 15  0 - 37 U/L   ALT 9  0 - 53 U/L   Alkaline Phosphatase 38 (*) 39 - 117 U/L   Total Bilirubin 0.2 (*) 0.3 - 1.2 mg/dL   GFR calc non Af Amer 28 (*) >90 mL/min   GFR calc Af Amer 32 (*) >90 mL/min   Comment:            The eGFR has been calculated     using the CKD EPI equation.     This calculation has not been     validated in all clinical     situations.     eGFR's persistently     <90 mL/min signify     possible Chronic Kidney Disease.  LIPASE, BLOOD     Status: Abnormal   Collection Time    04/25/13  8:07 PM      Result Value Range   Lipase 6 (*) 11 - 59 U/L  LACTIC ACID, PLASMA     Status: None   Collection Time    04/25/13  8:07 PM      Result Value Range   Lactic Acid, Venous 1.1  0.5 - 2.2 mmol/L  TROPONIN I     Status: None   Collection Time    04/25/13  8:07 PM      Result Value Range   Troponin I <0.30  <0.30 ng/mL   Comment:            Due to the release kinetics of cTnI,     a negative result within the first hours     of the onset of symptoms does not rule out     myocardial infarction with certainty.     If myocardial infarction is still suspected,     repeat the test at appropriate intervals.  SAMPLE TO BLOOD BANK     Status: None   Collection Time    04/25/13  8:08 PM      Result Value  Range   Blood Bank Specimen SAMPLE AVAILABLE FOR TESTING     Sample Expiration 04/28/2013    URINALYSIS, ROUTINE W REFLEX MICROSCOPIC     Status: Abnormal   Collection Time    04/25/13 10:38 PM      Result Value Range   Color, Urine YELLOW  YELLOW   APPearance HAZY (*) CLEAR   Specific Gravity, Urine 1.010  1.005 - 1.030   pH 6.0  5.0 - 8.0   Glucose, UA NEGATIVE  NEGATIVE mg/dL   Hgb urine dipstick TRACE (*) NEGATIVE   Bilirubin Urine NEGATIVE  NEGATIVE   Ketones, ur NEGATIVE  NEGATIVE mg/dL   Protein, ur  NEGATIVE  NEGATIVE mg/dL   Urobilinogen, UA 0.2  0.0 - 1.0 mg/dL   Nitrite NEGATIVE  NEGATIVE   Leukocytes, UA NEGATIVE  NEGATIVE  URINE MICROSCOPIC-ADD ON     Status: None   Collection Time    04/25/13 10:38 PM      Result Value Range   WBC, UA 0-2  <3 WBC/hpf  TYPE AND SCREEN     Status: None   Collection Time    04/25/13 10:47 PM      Result Value Range   ABO/RH(D) A NEG     Antibody Screen NEG     Sample Expiration 04/28/2013     Unit Number [id number]     Blood Component Type RED CELLS,LR     Unit division 00     Status of Unit ISSUED     Transfusion Status OK TO TRANSFUSE     Crossmatch Result Compatible     Unit Number [id number]     Blood Component Type RED CELLS,LR     Unit division 00     Status of Unit ALLOCATED     Transfusion Status OK TO TRANSFUSE     Crossmatch Result Compatible    PROTIME-INR     Status: Abnormal   Collection Time    04/25/13 11:12 PM      Result Value Range   Prothrombin Time 17.6 (*) 11.6 - 15.2 seconds   INR 1.49  0.00 - 1.49  PREPARE RBC (CROSSMATCH)     Status: None   Collection Time    04/25/13 11:58 PM      Result Value Range   Order Confirmation ORDER PROCESSED BY BLOOD BANK    MRSA PCR SCREENING     Status: Abnormal   Collection Time    04/26/13  1:00 AM      Result Value Range   MRSA by PCR POSITIVE (*) NEGATIVE   Comment:            The GeneXpert MRSA Assay (FDA     approved for NASAL specimens     only), is one component of a     comprehensive MRSA colonization     surveillance program. It is not     intended to diagnose MRSA     infection nor to guide or     monitor treatment for     MRSA infections.     RESULT CALLED TO, READ BACK BY AND VERIFIED WITH:     KINNEY N AT 0300 ON [id number] BY FORSYTH K  MAGNESIUM     Status: None   Collection Time    04/26/13  1:31 AM      Result Value Range   Magnesium 1.7  1.5 - 2.5 mg/dL  TROPONIN I     Status: None   Collection Time  04/26/13  1:31 AM      Result Value  Range   Troponin I <0.30  <0.30 ng/mL   Comment:            Due to the release kinetics of cTnI,     a negative result within the first hours     of the onset of symptoms does not rule out     myocardial infarction with certainty.     If myocardial infarction is still suspected,     repeat the test at appropriate intervals.  TROPONIN I     Status: None   Collection Time    04/26/13  7:16 AM      Result Value Range   Troponin I <0.30  <0.30 ng/mL   Comment:            Due to the release kinetics of cTnI,     a negative result within the first hours     of the onset of symptoms does not rule out     myocardial infarction with certainty.     If myocardial infarction is still suspected,     repeat the test at appropriate intervals.  CBC     Status: Abnormal   Collection Time    04/26/13  7:16 AM      Result Value Range   WBC 3.6 (*) 4.0 - 10.5 K/uL   RBC 2.61 (*) 4.22 - 5.81 MIL/uL   Hemoglobin 8.3 (*) 13.0 - 17.0 g/dL   HCT 16.1 (*) 09.6 - 04.5 %   MCV 92.3  78.0 - 100.0 fL   MCH 31.8  26.0 - 34.0 pg   MCHC 34.4  30.0 - 36.0 g/dL   RDW 40.9  81.1 - 91.4 %   Platelets 159  150 - 400 K/uL  COMPREHENSIVE METABOLIC PANEL     Status: Abnormal   Collection Time    04/26/13  7:16 AM      Result Value Range   Sodium 142  135 - 145 mEq/L   Potassium 3.6  3.5 - 5.1 mEq/L   Chloride 108  96 - 112 mEq/L   CO2 23  19 - 32 mEq/L   Glucose, Bld 100 (*) 70 - 99 mg/dL   BUN 18  6 - 23 mg/dL   Creatinine, Ser 7.82 (*) 0.50 - 1.35 mg/dL   Calcium 8.9  8.4 - 95.6 mg/dL   Total Protein 6.1  6.0 - 8.3 g/dL   Albumin 3.4 (*) 3.5 - 5.2 g/dL   AST 14  0 - 37 U/L   ALT 8  0 - 53 U/L   Alkaline Phosphatase 34 (*) 39 - 117 U/L   Total Bilirubin 1.6 (*) 0.3 - 1.2 mg/dL   GFR calc non Af Amer 34 (*) >90 mL/min   GFR calc Af Amer 40 (*) >90 mL/min   Comment:            The eGFR has been calculated     using the CKD EPI equation.     This calculation has not been     validated in all clinical      situations.     eGFR's persistently     <90 mL/min signify     possible Chronic Kidney Disease.  PROTIME-INR     Status: Abnormal   Collection Time    04/26/13  7:16 AM      Result Value Range   Prothrombin Time 15.6 (*) 11.6 - 15.2  seconds   INR 1.27  0.00 - 1.49    Ct Abdomen Pelvis Wo Contrast  04/25/2013   *RADIOLOGY REPORT*  Clinical Data: Abdominal pain all over.  Anemia and nausea. History of pancreatic adenocarcinoma in 2001.  CT ABDOMEN AND PELVIS WITHOUT CONTRAST  Technique:  Multidetector CT imaging of the abdomen and pelvis was performed following the standard protocol without intravenous contrast.  Comparison: 10/20/2012  Findings: Emphysematous changes in the lung bases.  Coronary artery calcifications.  Thickening of the wall of the distal esophagus could represent reflux esophagitis.  Examination of the abdominal structures is limited due to lack of intravenous contrast material. There appears to be surgical resection of the head and body of the pancreas.  Pancreatic tail is not enlarged.  Pneumobilia consistent with postoperative change. No bile duct dilatation.  The unenhanced appearance of the liver, spleen, gallbladder, adrenal glands, inferior vena cava, and retroperitoneal lymph nodes is unremarkable.  Celiac axis lymph nodes and retroperitoneal lymph nodes are present but without pathologic enlargement.  Bilateral intrarenal calcifications are stable since previous study.  No evidence of obstruction.  No hydronephrosis in either kidney.  Calcification of the abdominal aorta without aneurysm.  The stomach, small bowel, and colon are not abnormally distended.  Scattered stool in the colon.  Contrast material flows to the colon without evidence of obstruction.  No free air or free fluid in the abdomen.  Abdominal wall musculature appears intact.  Pelvis:  Mild prominence of prostate gland.  Bladder wall is not thickened.  No free or loculated pelvic fluid collections.  No  evidence of diverticulitis.  Appendix is not identified. Degenerative changes and mild scoliosis of the lumbar spine.  No destructive bone lesions are appreciated.  IMPRESSION: Postoperative changes in the pancreas with associated pneumobilia. Stable appearance of nonobstructing intrarenal stones bilaterally. No acute process demonstrated in the abdomen or pelvis.   Original Report Authenticated By: Burman Nieves, M.D.   Dg Chest 2 View  04/25/2013   *RADIOLOGY REPORT*  Clinical Data: Anemia.  Abdominal pain.  Abdominal distension. Syncope.  Numbness in all extremities.  CHEST - 2 VIEW  Comparison: 11/03/2012  Findings: Mild hyperinflation consistent with emphysematous change. The heart size and pulmonary vascularity are normal. The lungs appear clear and expanded without focal air space disease or consolidation. No blunting of the costophrenic angles.  No pneumothorax.  Mediastinal contours appear intact.  Calcification of the aorta.  Degenerative changes in the spine.  No significant change since previous study.  IMPRESSION: No evidence of active pulmonary disease.   Original Report Authenticated By: Burman Nieves, M.D.   Ct Head Wo Contrast  04/25/2013   *RADIOLOGY REPORT*  Clinical Data: Headaches for several days.  Syncope.  History of hypertension, diabetes, pancreatic adenocarcinoma.  CT HEAD WITHOUT CONTRAST  Technique:  Contiguous axial images were obtained from the base of the skull through the vertex without contrast.  Comparison: 10/20/2012  Findings: Moderate cerebral atrophy.  No ventricular dilatation. Patchy low attenuation changes in the deep white matter consistent with small vessel ischemia.  Extra-axial fluid collection in the posterior fossa probably represents small arachnoid cyst.  This is stable since the previous study.  There is no mass effect or midline shift.  Gray-white matter junctions are distinct.  Basal cisterns are not effaced.  No evidence of acute intracranial hemorrhage.   Vascular calcifications.  No depressed skull fractures.  Visualized paranasal sinuses and mastoid air cells are not opacified.  Deformity of the left medial orbital wall  may represent old fracture deformity.  IMPRESSION: No acute intracranial abnormalities.  Chronic atrophy and small vessel ischemic changes.  Small arachnoid cyst in the posterior fossa is stable.   Original Report Authenticated By: Burman Nieves, M.D.    ROS Blood pressure 122/72, pulse 88, temperature 98.8 F (37.1 C), temperature source Oral, resp. rate 13, height 6' (1.829 m), weight 169 lb 5 oz (76.8 kg), SpO2 100.00%. Physical Exam Alert and oriented. Skin warm and dry. Oral mucosa is moist.   . Sclera anicteric, conjunctivae is pink. Thyroid not enlarged. No cervical lymphadenopathy. Lungs clear. Heart regular rate and rhythm.  Abdomen is soft. Bowel sounds are positive. No hepatomegaly. No abdominal masses felt. Abdomen slightly tense.  No edema to lower extremities.    Assessment/Plan: GI bleed. Guaiac positive in the ED. Dr. Karilyn Cota in with patient also. Hx of GAVE. Incomplete colonoscopy in 2012 at [facility].  EGD/Colonoscopy  [name] W 04/26/2013, 8:38 AM    GI attending,s note; Patient interviewed and examined. He is well known to me from previous evaluations. He presents with syncopal episodes melena and found to be profoundly anemic. He has received 2 units of PRBCs. He is also being treated for penile cellulitis. It is possible he's been losing blood from upper GI tract given that he has GAVE and is anticoagulated. He has never had complete colonoscopy and therefore will need one. Patient is agreeable to proceed with EGD and colonoscopy to be performed in a.m.

## 2013-04-26 NOTE — Progress Notes (Signed)
ANTIBIOTIC CONSULT NOTE - INITIAL  Pharmacy Consult for Vancomycin Indication: cellulitis  No Known Allergies  Patient Measurements: Height: 6' (182.9 cm) Weight: 169 lb 5 oz (76.8 kg) IBW/kg (Calculated) : 77.6  Vital Signs: Temp: 98.8 F (37.1 C) (06/10 0757) Temp src: Oral (06/10 0757) BP: 138/86 mmHg (06/10 0800) Pulse Rate: 78 (06/10 0757) Intake/Output from previous day: 06/09 0701 - 06/10 0700 In: 1208.8 [I.V.:625.5; Blood:333.3; IV Piggyback:250] Out: 1175 [Urine:1175] Intake/Output from this shift:    Labs:  Recent Labs  04/25/13 2007 04/26/13 0716  WBC 4.9 3.6*  HGB 7.7* 8.3*  PLT 166 159  CREATININE 2.36*  --    Estimated Creatinine Clearance: 35.3 ml/min (by C-G formula based on Cr of 2.36). No results found for this basename: VANCOTROUGH, Leodis Binet, VANCORANDOM, GENTTROUGH, GENTPEAK, GENTRANDOM, TOBRATROUGH, TOBRAPEAK, TOBRARND, AMIKACINPEAK, AMIKACINTROU, AMIKACIN,  in the last 72 hours   Microbiology: Recent Results (from the past 720 hour(s))  MRSA PCR SCREENING     Status: Abnormal   Collection Time    04/26/13  1:00 AM      Result Value Range Status   MRSA by PCR POSITIVE (*) NEGATIVE Final   Comment:            The GeneXpert MRSA Assay (FDA     approved for NASAL specimens     only), is one component of a     comprehensive MRSA colonization     surveillance program. It is not     intended to diagnose MRSA     infection nor to guide or     monitor treatment for     MRSA infections.     RESULT CALLED TO, READ BACK BY AND VERIFIED WITH:     Roger Kill N AT 0300 ON [id number] BY FORSYTH K    Medical History: Past Medical History  Diagnosis Date  . Hypertension   . Diabetes mellitus, type II 2010  . Pancreatic adenocarcinoma 2011    Status post resection and chemotherapy; weight loss and malnutrition  . Depression   . Biliary obstruction 2012    Biliary stent replaced in 2012 secondary to reobstruction  . GERD (gastroesophageal reflux  disease)   . Anemia   . 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 Cataracts, bilateral   . GI bleed   . Arachnoid cyst     Medications:  Scheduled:  . amitriptyline  10 mg Oral BID  . [START ON 04/27/2013] Chlorhexidine Gluconate Cloth  6 each Topical Q0600  . gabapentin  100 mg Oral BID  . insulin aspart  0-15 Units Subcutaneous TID WC  . lipase/protease/amylase  2 capsule Oral TID WC  . metoprolol tartrate  12.5 mg Oral BID  . mupirocin ointment  1 application Nasal BID  . PARoxetine  30 mg Oral Daily  . piperacillin-tazobactam (ZOSYN)  IV  3.375 g Intravenous Q8H  . sodium chloride  3 mL Intravenous Q12H  . vancomycin  1,000 mg Intravenous Q24H   Assessment: 62 yo M with penile cellulitis.  He recently had partial circumcision.   His renal function is elevated but trending back to baseline today (baseline Scr ~ 1.6).    Vancomycin 04/26/13>> Zosyn 04/26/13>>  Goal of Therapy:  Vancomycin trough level 10-15 mcg/ml  Plan:  1) Zosyn 3.375gm IV Q8h to be infused over 4hrs 2) Vancomycin 1gm IV Q24h 3) Check Vancomycin trough at steady state 4) Monitor renal function and cx data   Saadiya Wilfong, [facility]  04/26/2013,8:00 AM

## 2013-04-27 ENCOUNTER — Encounter (HOSPITAL_COMMUNITY)
Admission: EM | Disposition: A | Discharge status: Home / Self Care | Payer: Self-pay | Source: Home / Self Care | Attending: Family Medicine

## 2013-04-27 ENCOUNTER — Encounter (HOSPITAL_COMMUNITY): Payer: Self-pay | Admitting: *Deleted

## 2013-04-27 DIAGNOSIS — D126 Benign neoplasm of colon, unspecified: Secondary | ICD-10-CM

## 2013-04-27 DIAGNOSIS — K31819 Angiodysplasia of stomach and duodenum without bleeding: Secondary | ICD-10-CM

## 2013-04-27 DIAGNOSIS — K449 Diaphragmatic hernia without obstruction or gangrene: Secondary | ICD-10-CM

## 2013-04-27 HISTORY — PX: COLONOSCOPY WITH ESOPHAGOGASTRODUODENOSCOPY (EGD): SHX5779

## 2013-04-27 LAB — BASIC METABOLIC PANEL
BUN: 13 mg/dL (ref 6–23)
CO2: 24 mEq/L (ref 19–32)
Calcium: 8.8 mg/dL (ref 8.4–10.5)
Creatinine, Ser: 1.83 mg/dL — ABNORMAL HIGH (ref 0.50–1.35)
GFR calc Af Amer: 44 mL/min — ABNORMAL LOW (ref >90)
GFR calc non Af Amer: 38 mL/min — ABNORMAL LOW (ref >90)
Glucose, Bld: 127 mg/dL — ABNORMAL HIGH (ref 70–99)
Potassium: 3.4 mEq/L — ABNORMAL LOW (ref 3.5–5.1)
Sodium: 138 mEq/L (ref 135–145)

## 2013-04-27 LAB — GLUCOSE, CAPILLARY
Glucose-Capillary: 124 mg/dL — ABNORMAL HIGH (ref 70–99)
Glucose-Capillary: 97 mg/dL (ref 70–99)
Glucose-Capillary: 113 mg/dL — ABNORMAL HIGH (ref 70–99)
Glucose-Capillary: 151 mg/dL — ABNORMAL HIGH (ref 70–99)

## 2013-04-27 LAB — CBC
Platelets: 176 10*3/uL (ref 150–400)
RDW: 13.1 % (ref 11.5–15.5)
WBC: 3.5 10*3/uL — ABNORMAL LOW (ref 4.0–10.5)

## 2013-04-27 SURGERY — COLONOSCOPY WITH ESOPHAGOGASTRODUODENOSCOPY (EGD)
Anesthesia: Moderate Sedation

## 2013-04-27 MED ORDER — MIDAZOLAM HCL 5 MG/5ML IJ SOLN
INTRAMUSCULAR | Status: AC
  Filled 2013-04-27: qty 10

## 2013-04-27 MED ORDER — BUTAMBEN-TETRACAINE-BENZOCAINE 2-2-14 % EX AERO
INHALATION_SPRAY | CUTANEOUS | Status: DC | PRN
  Administered 2013-04-27: 2 via TOPICAL

## 2013-04-27 MED ORDER — MEPERIDINE HCL 50 MG/ML IJ SOLN
INTRAMUSCULAR | Status: AC
  Filled 2013-04-27: qty 1

## 2013-04-27 MED ORDER — VANCOMYCIN HCL IN DEXTROSE 1-5 GM/200ML-% IV SOLN
INTRAVENOUS | Status: AC
  Filled 2013-04-27: qty 200

## 2013-04-27 MED ORDER — MIDAZOLAM HCL 5 MG/5ML IJ SOLN
INTRAMUSCULAR | Status: DC | PRN
  Administered 2013-04-27 (×3): 2 mg via INTRAVENOUS

## 2013-04-27 MED ORDER — SODIUM CHLORIDE 0.9 % IV SOLN
INTRAVENOUS | Status: DC
  Administered 2013-04-27: 1000 mL via INTRAVENOUS

## 2013-04-27 MED ORDER — AMOXICILLIN-POT CLAVULANATE 500-125 MG PO TABS
1.0000 | ORAL_TABLET | Freq: Two times a day (BID) | ORAL | Status: DC
  Administered 2013-04-27 – 2013-04-28 (×3): 500 mg via ORAL
  Filled 2013-04-27 (×7): qty 1

## 2013-04-27 MED ORDER — INSULIN ASPART 100 UNIT/ML ~~LOC~~ SOLN
0.0000 [IU] | Freq: Three times a day (TID) | SUBCUTANEOUS | Status: DC
  Administered 2013-04-27: 2 [IU] via SUBCUTANEOUS
  Administered 2013-04-28: 1 [IU] via SUBCUTANEOUS

## 2013-04-27 MED ORDER — MEPERIDINE HCL 50 MG/ML IJ SOLN
INTRAMUSCULAR | Status: DC | PRN
  Administered 2013-04-27 (×2): 25 mg via INTRAVENOUS

## 2013-04-27 MED ORDER — STERILE WATER FOR IRRIGATION IR SOLN
Status: DC | PRN
  Administered 2013-04-27: 12:00:00

## 2013-04-27 NOTE — Op Note (Addendum)
EGD PROCEDURE REPORT  PATIENT:  [name]  MR#:  [id number] Birthdate:  [birth year], 62 y.o., male Endoscopist:  Dr. Malissa Hippo, MD Referred By:  Dr. Virginia Rochester, MD Procedure Date: 04/27/2013  Procedure:   EGD & Colonoscopy  Indications: Patient is 62 year old Caucasian male who presents with melena and profound anemia. He has received 2 units of PRBCs his hemoglobin is up to 8.2 g. On his last EGD was found to have a few telangiectasia in gastric antrum. Patient has never had colonoscopy. His last exam at [facility] was incomplete secondary to poor prep.              Informed Consent:  The risks, benefits, alternatives & imponderables which include, but are not limited to, bleeding, infection, perforation, drug reaction and potential missed lesion have been reviewed.  The potential for biopsy, lesion removal, esophageal dilation, etc. have also been discussed.  Questions have been answered.  All parties agreeable.  Please see history & physical in medical record for more information.  Medications:  Demerol 50 mg IV Versed 6 mg IV Cetacaine spray topically for oropharyngeal anesthesia  EGD  Description of procedure:  The endoscope was introduced through the mouth and advanced to the second portion of the duodenum without difficulty or limitations. The mucosal surfaces were surveyed very carefully during advancement of the scope and upon withdrawal.  Findings:  Esophagus:  Mucosa of the esophagus was normal. GE junction was wavy otherwise unremarkable GEJ:  37 cm Hiatus:  39 cm Stomach:  Stomach was empty and distended very well with insufflation. Folds in the proximal stomach are normal. Examination of mucosa revealed a few small hyperplastic polyps in gastric body which were left alone. Few scattered telangiectasia noted at gastric antrum without stigmata leading. Although the child was taken. A nurse fundus and cardia were examined by retroflex the scope and were normal. Small  bowel; both afferent and efferent loops were examined and no abnormality noted. Please note patient has had pylorus sparing pancreaticoduodenectomy.  Therapeutic/Diagnostic Maneuvers Performed:  None    COLONOSCOPY Description of procedure:  After a digital rectal exam was performed, that colonoscope was advanced from the anus through the rectum and colon to the area of the cecum, ileocecal valve and appendiceal orifice. The cecum was deeply intubated. These structures were well-seen and photographed for the record. From the level of the cecum and ileocecal valve, the scope was slowly and cautiously withdrawn. The mucosal surfaces were carefully surveyed utilizing scope tip to flexion to facilitate fold flattening as needed. The scope was pulled down into the rectum where a thorough exam including retroflexion was performed.  Findings:   Prep satisfactory. Two small polyps ablated via cold biopsy and submitted together. These are located at cecum and descending colon. 7 to 8mm submucosal lipoma noted close to ileocecal valve. 5 mm polyp cold snare from the rectosigmoid junction. Normal rectal mucosa. Small hemorrhoids below the dentate line.   Therapeutic/Diagnostic Maneuvers Performed:  See above  Complications:  None  Cecal Withdrawal Time:  13 minutes  Impression:  EGD findings; Small sliding hiatal hernia. Few small hyperplastic appearing polyps in gastric body which were left alone. Few small scattered telangiectasia at antrum without stigmata of bleeding. Colonoscopy findings; Examination performed to cecum. Three small polyps removed; two of these were removed via cold biopsy and submitted together(6 cecum and descending colon). Third polyp was cold snared from rectosigmoid. Incidental finding of small submucosal lipoma at ascending colon. Small external hemorrhoids.  Not  convinced that patient bled from gastric telangiectasia. If bleeding recurs will consider small bowel  given capsule study.   Recommendations:  Advanced diet. H&H in a.m. Reevaluate need for continued anticoagulation.  Will consider given capsule study if anticoagulation is to be continued.  Kaniel Kiang U  04/27/2013 1:31 PM  CC: Dr. Provider Not In System & Dr. No ref. provider found

## 2013-04-27 NOTE — Progress Notes (Signed)
04/27/13 1552 Patient requested to speak with Case Manager regarding ability to afford medications at discharge. Notified Amy Leanord Hawking, Case Manager. Stated will see patient and address. Earnstine Regal, RN

## 2013-04-27 NOTE — Consult Note (Signed)
[name], BAKOS NO.:  [id number]  MEDICAL RECORD NO.:  [id number]  LOCATION:  IC07                          FACILITY:  [facility]  PHYSICIAN:  Ky Barban, M.D.DATE OF BIRTH:  28-Jun-1951  DATE OF CONSULTATION: DATE OF DISCHARGE:                                CONSULTATION   CHIEF COMPLAINT:  Infected foreskin.  HISTORY:  This 62 year old gentleman who was admitted because of generalized weakness, he was found to be anemic and during the workup, he was found to recently had dorsal slit made in [facility] and looks like it was infected, so I was called him to see him. The patient has no fever and no chills.  He has generalized weakness, which is lack of his hemoglobin and he also has hypertension, diabetes type 2.  He also has Whipple procedure done, pancreatic adenocarcinoma in 2011.  Suffers from depression and has biliary obstruction.  He is being followed by the gastroenterologist, Dr. Karilyn Cota over here who suggested that he needs to have upper endo and colonoscopy tomorrow.  He also suffers from GERD, ventricular hypokinesis.  Head surgery, bilateral cataract surgery, history of GI bleed.  Surgical procedure include Whipple procedure in 2011.  ERCP with plastic stent placement in 2012.  Appendectomy.  PHYSICAL EXAMINATION:  GENERAL:  Moderately built male, not in acute distress.  Fully conscious, alert, oriented. VITAL SIGNS:  Blood pressure 167/78, pulse is 63 per minute, O2 saturation on room air is 98%, temperature 98.2. ABDOMEN:  Soft, flat.  Liver, spleen and kidneys are not palpable. GENITOURINARY:  External genitalia, I can say well-healed dorsal slit, little bit watery discharge around the glans penis, but I do not see any purulent discharge.  The patient already been started on vancomycin and Zosyn, and the culture does not look like they have done the cultures from the discharge.  Anyway, I will see clinical response looks  good.  I do not have to add anything else.  He has already been started on antibiotics.  LABORATORY DATA:  Electrolytes; sodium 142, potassium 3.6, chloride 108, CO2 is 23, BUN is 18, creatinine 1.98, and his glucose is 100.  WBC count is 3.6, hematocrit is 24.1.  IMPRESSION:  Status post dorsal slit.  Infected wound, which is healing well.  Diabetes mellitus, status post Whipple procedure for pancreatic cancer, anemia and gastrointestinal bleeding.  Positive guaiac test.  He is being followed with the GI consult.  I do not need to add anything else.  He has already been started on antibiotic.  I am not going to follow him on regular basis.  If anybody needs me to come and see me, I will be glad to see him.  Otherwise, he can go back and see his urologist whoever done his circumcision, dorsal slit in [facility].     Ky Barban, M.D.     MIJ/MEDQ  D:  04/26/2013  T:  04/27/2013  Job:  [id number]

## 2013-04-27 NOTE — Clinical Documentation Improvement (Signed)
Anemia Blood Loss Clarification  THIS DOCUMENT IS NOT A PERMANENT PART OF THE MEDICAL RECORD  RESPOND TO THE THIS QUERY, FOLLOW THE INSTRUCTIONS BELOW:  1. If needed, update documentation for the patient's encounter via the notes activity.  2. Access this query again and click edit on the In [employer].  3. After updating, or not, click F2 to complete all highlighted (required) fields concerning your review. Select "additional documentation in the medical record" OR "no additional documentation provided".  4. Click Sign note button.  5. The deficiency will fall out of your In Basket *Please let us know if you are not able to complete this workflow by phone or e-mail (listed below).        04/27/13  Dear Dr. Irene Limbo Marton Redwood  In an effort to better capture your patient's severity of illness, reflect appropriate length of stay and utilization of resources, a review of the patient medical record has revealed the following indicators.   Based on your clinical judgment, please clarify and document in a progress note and/or discharge summary the clinical condition associated with the following supporting information: In responding to this query please exercise your independent judgment.  The fact that a query is asked, does not imply that any particular answer is desired or expected.  Please clarify anemia. Thank you.  Possible Clinical Conditions?   " Expected Acute Blood Loss Anemia " Acute Blood Loss Anemia " Acute on chronic blood loss anemia " Chronic blood loss anemia " Precipitous drop in Hematocrit " Other Condition________________ " Cannot Clinically Determine   Supporting Information: Risk Factors:  History of GI Bleed, anemia and pancreatic adenocarcinoma Admitted with anemia  Signs and Symptoms  Melena guaiac-positive stools i  Diagnostics: H&H 11/03/12 = 11.0/30.1 04/25/13 = 7.7/22.8 04/26/13 = 8.3/24.1 04/27/13 = 8.2/23.7  Treatments: Transfusion: 2 units  PRBC Monitor CBC   Reviewed:  no additional documentation provided  Thank You,  Harless Litten  RN, MSN Clinical Documentation Specialist: Office# [phone] [facility] Health Information Management [place]

## 2013-04-27 NOTE — Progress Notes (Signed)
[facility] PROGRESS NOTE  [name] UJW:[id number] DOB: 10/08/1951 DOA: 04/25/2013 PCP: Provider Not In System [name]   Assessment/Plan: 1. Normocytic Anemia: Acute or chronic. Stable status post 2 units packed red blood cells. Upper and lower endoscopy plan today. GI consultation appreciated. 2. Syncope: No recurrence. Telemetry unremarkable. Suspect secondary to anemia. Followup 2-D echocardiogram. 3. Infected foreskin: Appears to be resolved. Continue wound care per urology. Change to oral antibiotics. s/p dorsal slit prior to admission. 4. Diabetes mellitus: Well controlled. Sliding-scale insulin. 5. Chronic kidney disease stage III: Stable. 6. History of GAVE 7. History of Pulmonary embolism 11/2012: INR subtherapeutic on admission. Patient reports repeat scans subsequently have been negative. At this point to be considered to have completed or nearly completed treatment. Start prophylactic Lovenox when okay with GI. Will discuss anti-coagulation with GI. May be able to come off Coumadin. 8. History of Pancreatic adenocarcinoma status post resection: underwent pancreatic resection at [facility]in 2012. Hx of anemia since his diagnosis of pancreatic carcinoma. He sees Dr. [name] at [facility] for follow ups   Check orthostatics  Followup 2-D echocardiogram  Change to oral antibiotics  Start prophylactic Lovenox when okay with GI  Discuss anticoagulation which he  Code Status: Full code DVT prophylaxis: SCDs Family Communication: None present Disposition Plan: Home when improved  [name], MD  [facility]  Pager [phone] If 7PM-7AM, please contact night-coverage at [email or web address], password [facility] 04/27/2013, 7:28 AM  LOS: 2 days   Clinical Summary: 62 year old male  Consultants:  Gastroenterology  Urology  Procedures:  Transfusion 2 units packed red blood cells  2-D  echocardiogram:  Antibiotics:  Zosyn  Vancomycin  HPI/Subjective: Afebrile, vitals stable. No bleeding. Overall no complaints. Penile wound improving.   Objective: Filed Vitals:   04/27/13 0400 04/27/13 0500 04/27/13 0600 04/27/13 0700  BP: 140/85 154/92 141/76 154/85  Pulse: 64   62  Temp: 98.6 F (37 C)     TempSrc: Oral     Resp: 17 11 13 11   Height:      Weight:  76.8 kg (169 lb 5 oz)    SpO2: 99%   99%    Intake/Output Summary (Last 24 hours) at 04/27/13 0728 Last data filed at 04/27/13 0310  Gross per 24 hour  Intake   2170 ml  Output      0 ml  Net   2170 ml     Filed Weights   04/26/13 0106 04/26/13 0500 04/27/13 0500  Weight: 74.9 kg (165 lb 2 oz) 76.8 kg (169 lb 5 oz) 76.8 kg (169 lb 5 oz)    Exam:  General:  Appears calm and comfortable Cardiovascular: RRR, no m/r/g. No LE edema. Telemetry: SR, no arrhythmias  GU: healing foreskin, no significant erythema of penis or foreskin.  Respiratory: CTA bilaterally, no w/r/r. Normal respiratory effort. Abdomen: soft, ntnd Psychiatric: grossly normal mood and affect, speech fluent and appropriate  Data Reviewed:  Creatinine 1.83. Hemoglobin 8.2.  Pending studies:  Echocardiogram  Scheduled Meds: . amitriptyline  10 mg Oral BID  . Chlorhexidine Gluconate Cloth  6 each Topical Q0600  . gabapentin  100 mg Oral BID  . lipase/protease/amylase  2 capsule Oral TID WC  . metoprolol tartrate  12.5 mg Oral BID  . mupirocin ointment  1 application Nasal BID  . mupirocin ointment  1 application Topical BID  . PARoxetine  30 mg Oral Daily  . piperacillin-tazobactam (ZOSYN)  IV  3.375 g Intravenous Q8H  . sodium chloride  3 mL Intravenous Q12H  . vancomycin  1,000 mg Intravenous Q24H   Continuous Infusions: . dextrose 5 % and 0.9% NaCl 50 mL/hr at 04/26/13 1330    Principal Problem:   Syncope Active Problems:   Anemia   Hypertension   DM type 2 causing renal disease   Pancreatic adenocarcinoma    Penile cellulitis   Time spent 25 minutes

## 2013-04-27 NOTE — Progress Notes (Signed)
Report called to Earnstine Regal, RN and pt will be going to room 325 and will be transferred via wheelchair along with personal items including a cell phone and clothing.

## 2013-04-28 ENCOUNTER — Encounter (HOSPITAL_COMMUNITY): Payer: Self-pay | Admitting: Internal Medicine

## 2013-04-28 LAB — TYPE AND SCREEN
Antibody Screen: NEGATIVE
Unit division: 0

## 2013-04-28 LAB — CBC
HCT: 24.9 % — ABNORMAL LOW (ref 39.0–52.0)
MCH: 32.7 pg (ref 26.0–34.0)
MCHC: 34.9 g/dL (ref 30.0–36.0)
MCV: 93.6 fL (ref 78.0–100.0)
RDW: 13.1 % (ref 11.5–15.5)

## 2013-04-28 LAB — GLUCOSE, CAPILLARY

## 2013-04-28 MED ORDER — AMOXICILLIN-POT CLAVULANATE 500-125 MG PO TABS: 1.0000 | ORAL_TABLET | Freq: Two times a day (BID) | ORAL | Status: DC

## 2013-04-28 MED ORDER — ACETAMINOPHEN 325 MG PO TABS
650.0000 mg | ORAL_TABLET | Freq: Four times a day (QID) | ORAL | Status: DC | PRN
  Administered 2013-04-28: 650 mg via ORAL
  Filled 2013-04-28: qty 2

## 2013-04-28 MED ORDER — MUPIROCIN 2 % EX OINT: 1.0000 "application " | TOPICAL_OINTMENT | Freq: Two times a day (BID) | CUTANEOUS | Status: DC

## 2013-04-28 MED ORDER — FERROUS SULFATE 325 (65 FE) MG PO TBEC: 325.0000 mg | DELAYED_RELEASE_TABLET | Freq: Three times a day (TID) | ORAL | Status: DC

## 2013-04-28 NOTE — Progress Notes (Signed)
[facility] PROGRESS NOTE  [name] KGM:[id number] DOB: 02-Jul-1951 DOA: 04/25/2013 PCP: Provider Not In System [facility] Medical Dr. Andrey Campanile  Assessment/Plan: 1. Normocytic Anemia: Acute or chronic. Hemoglobin stable status post 2 units packed red blood cells. Upper and lower endoscopy as below. GI consultation appreciated. Not convinced that patient bled from gastric telangiectasia. If bleeding recurs will consider small bowel given capsule study. 2. Possible GI bleed prior to admission: Workup as above. Continue PPI. Iron. Discussed with Dr. Karilyn Cota, stable for discharge. 3. Syncope: No recurrence. Orthostatics negative. Telemetry unremarkable. Suspect secondary to anemia. 2-D echocardiogram unremarkable. 4. Infected foreskin: Appears to be resolved. Continue wound care per urology. Change to oral antibiotics. s/p dorsal slit prior to admission. 5. Diabetes mellitus: Well controlled. Sliding-scale insulin. 6. Chronic kidney disease stage III: Stable. 7. History of GAVE 8. History of Pulmonary embolism 10/2012: Asymptomatic. Case discussed with patient's primary care physician Dr. Andrey Campanile by telephone. Pulmonary embolism was diagnosed in December of last year the patient has had treatment with warfarin since that time, nearly 6 months now. This was the first VTE. Asymptomatic. Given symptomatic anemia and suspected recent GI bleed the benefits of anticoagulation are outweighed by the risks. Patient has had nearly 6 month treatment for first VTE and therefore both Dr. Andrey Campanile and I feel warfarin can be discontinued. 9. History of Pancreatic adenocarcinoma status post resection: underwent pancreatic resection at [facility]in 2012. Hx of anemia since his diagnosis of pancreatic carcinoma. He sees Dr. [name] at [facility] for follow ups   Home today  Code Status: Full code DVT prophylaxis: SCDs Family Communication: None present Disposition Plan: Home today.  Edwin Sacks,  MD  [facility]  Pager [phone] If 7PM-7AM, please contact night-coverage at [email or web address], password [facility] 04/28/2013, 2:25 PM  LOS: 3 days   Clinical Summary: 62 year old male presents to the emergency department with history of generalized weakness, fatigue for one week as well as syncope. His primary care physician and noted him to be anemic and he was referred to the emergency department. He had no chest pain or shortness of breath with this. He was admitted for further evaluation of syncope and anemia.  Patient was transfused 2 units packed red blood cells and had no evidence of ongoing bleeding. He was seen in consultation with gastroenterology and underwent upper and lower endoscopy which did not reveal a clear source of bleeding. He will continue on PPI therapy and be started on iron. He has been cleared for discharge home by gastroenterology. Upon further reflection it is presumed that his anemia was the etiology for her syncope. 2-D echocardiogram was unremarkable. History of pulmonary embolism 10/2012 and has received full treatment for this. His hemoglobin stabilized and he will be discharged home.  Consultants:  Gastroenterology  Urology  Physical therapy: No followup required.  Procedures:  Transfusion 2 units packed red blood cells  2-D echocardiogram: Left ventricular ejection fraction 50-55%. Normal wall motion. No regional wall motion abnormalities.  Upper endoscopy:  Small sliding hiatal hernia. Few small hyperplastic appearing polyps in gastric body which were left alone. Few small scattered telangiectasia at antrum without stigmata of bleeding.  Colonoscopy: Examination performed to cecum. Three small polyps removed; two of these were removed via cold biopsy and submitted together(6 cecum and descending colon). Third polyp was cold snared from rectosigmoid. Incidental finding of small submucosal lipoma at ascending colon. Small external hemorrhoids.    Antibiotics:  Zosyn 6/10 >> 6/11  Vancomycin 6/10 >> 6/11  Augmentin 6/11 >>  6/16  HPI/Subjective: Afebrile, vitals stable. Had a headache earlier today. Ambulating without difficulty. No bleeding. One dark brown stool today. No blood.   Objective: Filed Vitals:   04/27/13 2213 04/28/13 0500 04/28/13 0502 04/28/13 0504  BP: 98/65 112/68 96/62 104/64  Pulse: 101 77 82 86  Temp:  98.4 F (36.9 C)    TempSrc:  Oral    Resp:      Height:      Weight:      SpO2:  97%      Intake/Output Summary (Last 24 hours) at 04/28/13 1425 Last data filed at 04/28/13 1010  Gross per 24 hour  Intake    123 ml  Output    600 ml  Net   -477 ml     Filed Weights   04/26/13 0106 04/26/13 0500 04/27/13 0500  Weight: 74.9 kg (165 lb 2 oz) 76.8 kg (169 lb 5 oz) 76.8 kg (169 lb 5 oz)    Exam:  General:  Appears calm and comfortable Cardiovascular: RRR, no m/r/g. No LE edema. Respiratory: CTA bilaterally, no w/r/r. Normal respiratory effort. Psychiatric: grossly normal mood and affect, speech fluent and appropriate  Data Reviewed:  Capillary blood sugars stable. Hemoglobin 8.7.  Pending studies:  None   Scheduled Meds: . amitriptyline  10 mg Oral BID  . amoxicillin-clavulanate  1 tablet Oral BID  . Chlorhexidine Gluconate Cloth  6 each Topical Q0600  . gabapentin  100 mg Oral BID  . insulin aspart  0-9 Units Subcutaneous TID WC  . lipase/protease/amylase  2 capsule Oral TID WC  . metoprolol tartrate  12.5 mg Oral BID  . mupirocin ointment  1 application Nasal BID  . mupirocin ointment  1 application Topical BID  . PARoxetine  30 mg Oral Daily  . sodium chloride  3 mL Intravenous Q12H   Continuous Infusions:    Principal Problem:   Syncope Active Problems:   Anemia   Hypertension   DM type 2 causing renal disease   Pancreatic adenocarcinoma   Penile cellulitis

## 2013-04-28 NOTE — Discharge Summary (Addendum)
Physician Discharge Summary  [name] ZOX:[id number] DOB: 1951/06/27 DOA: 04/25/2013  PCP: Edwin Raymond, MD  Admit date: 04/25/2013 Discharge date: 04/28/2013  Recommendations for Outpatient Follow-up:  1. Followup normocytic anemia 2. If has recurrent GI bleeding, consider capsule endoscopy 3. Follow up infected foreskin 4. See below regard to history of pulmonary embolism   Follow-up Information   Follow up with Edwin U, MD In 2 weeks.   Contact information:   [address], [address] [place] [address] [phone]      Discharge Diagnoses:  1. Syncope 2. Normocytic anemia 3. Possible GI bleed prior to admission 4. Infected foreskin 5. Diabetes mellitus 6. Chronic kidney disease stage III  7. History of pulmonary embolism 10/2012  Discharge Condition: Improved Disposition: Home  Diet recommendation: Heart healthy, diabetic diet  Filed Weights   04/26/13 0106 04/26/13 0500 04/27/13 0500  Weight: 74.9 kg (165 lb 2 oz) 76.8 kg (169 lb 5 oz) 76.8 kg (169 lb 5 oz)    History of present illness:  62 year old male presents to the emergency department with history of generalized weakness, fatigue for one week as well as syncope. His primary care physician and noted him to be anemic and he was referred to the emergency department. He had no chest pain or shortness of breath with this. He was admitted for further evaluation of syncope and anemia.  Hospital Course:  [name] was transfused 2 units packed red blood cells and had no evidence of ongoing bleeding. He was seen in consultation with gastroenterology and underwent upper and lower endoscopy which did not reveal a clear source of bleeding. He will continue on PPI therapy and be started on iron. He has been cleared for discharge home by gastroenterology. Upon further reflection it is presumed that his anemia was the etiology for syncope. 2-D echocardiogram was unremarkable. History of pulmonary embolism  10/2012 and has received full treatment for this. His hemoglobin stabilized and he will be discharged home.  1. Normocytic Anemia: Acute on chronic blood loss anemia. Hemoglobin stable status post 2 units packed red blood cells. Upper and lower endoscopy as below. GI consultation appreciated. Not convinced that patient bled from gastric telangiectasia. If bleeding recurs will consider small bowel given capsule study. 2. Possible GI bleed prior to admission: Workup as above. Continue PPI. Iron. Discussed with [name], stable for discharge. 3. Syncope: No recurrence. Orthostatics negative. Telemetry unremarkable. Suspect secondary to anemia. 2-D echocardiogram unremarkable. 4. Infected foreskin: Appears to be resolved. Continue wound care per urology. Change to oral antibiotics. s/p dorsal slit prior to admission. 5. Diabetes mellitus: Well controlled. Sliding-scale insulin. 6. Chronic kidney disease stage III: Stable. 7. History of GAVE 8. History of Pulmonary embolism 10/2012: Asymptomatic. Case discussed with patient's primary care physician [name] by telephone. Pulmonary embolism was diagnosed in December of last year the patient has had treatment with warfarin since that time, nearly 6 months now. This was the first VTE. Asymptomatic. Given symptomatic anemia and suspected recent GI bleed the benefits of anticoagulation are outweighed by the risks. Patient has had nearly 6 month treatment for first VTE and therefore both [name] and I feel warfarin can be discontinued. 9. History of Pancreatic adenocarcinoma status post resection: underwent   Consultants:  Gastroenterology  Urology  Physical therapy: No followup required. Procedures:  Transfusion 2 units packed red blood cells  2-D echocardiogram: Left ventricular ejection fraction 50-55%. Normal wall motion. No regional wall motion abnormalities. Upper endoscopy: Small sliding  hiatal hernia. Few small hyperplastic appearing polyps in  gastric body which were left alone. Few small scattered telangiectasia at antrum without stigmata of bleeding. Colonoscopy: Examination performed to cecum. Three small polyps removed; two of these were removed via cold biopsy and submitted together(6 cecum and descending colon). Third polyp was cold snared from rectosigmoid. Incidental finding of small submucosal lipoma at ascending colon. Small external hemorrhoids.  Antibiotics:  Zosyn 6/10 >> 6/11  Vancomycin 6/10 >> 6/11  Augmentin 6/11 >> 6/16  Discharge Instructions  Discharge Orders   Future Orders Complete By Expires     Activity as tolerated - No restrictions  As directed     Diet Carb Modified  As directed     Discharge instructions  As directed     Comments:      Take iron as directed for anemia. Note this medication will cause the stool to turn black and can cause constipation, you may need a stool softener. Call your physician or seek immediate medical attention for fainting, bleeding or worsening of your condition.        Medication List    STOP taking these medications       warfarin 1 MG tablet  Commonly known as:  COUMADIN      TAKE these medications       amitriptyline 10 MG tablet  Commonly known as:  ELAVIL  Take 10 mg by mouth 2 (two) times daily.     amoxicillin-clavulanate 500-125 MG per tablet  Commonly known as:  AUGMENTIN  Take 1 tablet (500 mg total) by mouth 2 (two) times daily.     clonazePAM 1 MG tablet  Commonly known as:  KLONOPIN  Take 1 mg by mouth 2 (two) times daily as needed for anxiety (and/or sleep).     docusate sodium 100 MG capsule  Commonly known as:  COLACE  Take 100 mg by mouth daily as needed for constipation.     ferrous sulfate 325 (65 FE) MG EC tablet  Take 1 tablet (325 mg total) by mouth 3 (three) times daily with meals.     gabapentin 100 MG capsule  Commonly known as:  NEURONTIN  Take 100 mg by mouth 2 (two) times daily.     glipiZIDE 5 MG tablet  Commonly known  as:  GLUCOTROL  Take 5 mg by mouth daily.     megestrol 40 MG tablet  Commonly known as:  MEGACE  Take 1 tablet (40 mg total) by mouth 2 (two) times daily.     metoprolol tartrate 25 MG tablet  Commonly known as:  LOPRESSOR  Take 12.5 mg by mouth 2 (two) times daily.     mupirocin ointment 2 %  Commonly known as:  BACTROBAN  Apply 1 application topically 2 (two) times daily. Apply to penis.     ondansetron 8 MG disintegrating tablet  Commonly known as:  ZOFRAN-ODT  Take 8 mg by mouth every 8 (eight) hours as needed for nausea.     pantoprazole 40 MG tablet  Commonly known as:  PROTONIX  Take 1 tablet (40 mg total) by mouth daily.     PARoxetine 30 MG tablet  Commonly known as:  PAXIL  Take 30 mg by mouth daily.     vitamin B-12 1000 MCG tablet  Commonly known as:  CYANOCOBALAMIN  Take 1,000 mcg by mouth daily.     ZENPEP 10000 UNITS Cpep  Generic drug:  Pancrelipase (Lip-Prot-Amyl)  Take 2 capsules by mouth 3 (  three) times daily.       No Known Allergies  The results of significant diagnostics from this hospitalization (including imaging, microbiology, ancillary and laboratory) are listed below for reference.    Significant Diagnostic Studies: Ct Abdomen Pelvis Wo Contrast  04/25/2013   *RADIOLOGY REPORT*  Clinical Data: Abdominal pain all over.  Anemia and nausea. History of pancreatic adenocarcinoma in 2001.  CT ABDOMEN AND PELVIS WITHOUT CONTRAST  Technique:  Multidetector CT imaging of the abdomen and pelvis was performed following the standard protocol without intravenous contrast.  Comparison: 10/20/2012  Findings: Emphysematous changes in the lung bases.  Coronary artery calcifications.  Thickening of the wall of the distal esophagus could represent reflux esophagitis.  Examination of the abdominal structures is limited due to lack of intravenous contrast material. There appears to be surgical resection of the head and body of the pancreas.  Pancreatic tail is not  enlarged.  Pneumobilia consistent with postoperative change. No bile duct dilatation.  The unenhanced appearance of the liver, spleen, gallbladder, adrenal glands, inferior vena cava, and retroperitoneal lymph nodes is unremarkable.  Celiac axis lymph nodes and retroperitoneal lymph nodes are present but without pathologic enlargement.  Bilateral intrarenal calcifications are stable since previous study.  No evidence of obstruction.  No hydronephrosis in either kidney.  Calcification of the abdominal aorta without aneurysm.  The stomach, small bowel, and colon are not abnormally distended.  Scattered stool in the colon.  Contrast material flows to the colon without evidence of obstruction.  No free air or free fluid in the abdomen.  Abdominal wall musculature appears intact.  Pelvis:  Mild prominence of prostate gland.  Bladder wall is not thickened.  No free or loculated pelvic fluid collections.  No evidence of diverticulitis.  Appendix is not identified. Degenerative changes and mild scoliosis of the lumbar spine.  No destructive bone lesions are appreciated.  IMPRESSION: Postoperative changes in the pancreas with associated pneumobilia. Stable appearance of nonobstructing intrarenal stones bilaterally. No acute process demonstrated in the abdomen or pelvis.   Original Report Authenticated By: Burman Nieves, M.D.   Dg Chest 2 View  04/25/2013   *RADIOLOGY REPORT*  Clinical Data: Anemia.  Abdominal pain.  Abdominal distension. Syncope.  Numbness in all extremities.  CHEST - 2 VIEW  Comparison: 11/03/2012  Findings: Mild hyperinflation consistent with emphysematous change. The heart size and pulmonary vascularity are normal. The lungs appear clear and expanded without focal air space disease or consolidation. No blunting of the costophrenic angles.  No pneumothorax.  Mediastinal contours appear intact.  Calcification of the aorta.  Degenerative changes in the spine.  No significant change since previous study.     Original Report Authenticated By: Burman Nieves, M.D.   Ct Head Wo Contrast  04/25/2013   *RADIOLOGY REPORT*  Clinical Data: Headaches for several days.  Syncope.  History of hypertension, diabetes, pancreatic adenocarcinoma.  CT HEAD WITHOUT CONTRAST  Technique:  Contiguous axial images were obtained from the base of the skull through the vertex without contrast.  Comparison: 10/20/2012  Findings: Moderate cerebral atrophy.  No ventricular dilatation. Patchy low attenuation changes in the deep white matter consistent with small vessel ischemia.  Extra-axial fluid collection in the posterior fossa probably represents small arachnoid cyst.  This is stable since the previous study.  There is no mass effect or midline shift.  Gray-white matter junctions are distinct.  Basal cisterns are not effaced.  No evidence of acute intracranial hemorrhage.  Vascular calcifications.  No depressed skull fractures.  Visualized paranasal sinuses and mastoid air cells are not opacified.  Deformity of the left medial orbital wall may represent old fracture deformity.  IMPRESSION: No acute intracranial abnormalities.  Chronic atrophy and small vessel ischemic changes.  Small arachnoid cyst in the posterior fossa is stable.   Original Report Authenticated By: Burman Nieves, M.D.    Microbiology: Recent Results (from the past 240 hour(s))  URINE CULTURE     Status: None   Collection Time    04/25/13 10:38 PM      Result Value Range Status   Specimen Description URINE, CLEAN CATCH   Final   Special Requests NONE   Final   Culture  Setup Time 04/25/2013 23:00   Final   Colony Count NO GROWTH   Final   Culture NO GROWTH   Final   Report Status 04/26/2013 FINAL   Final  MRSA PCR SCREENING     Status: Abnormal   Collection Time    04/26/13  1:00 AM      Result Value Range Status   MRSA by PCR POSITIVE (*) NEGATIVE Final   Comment:            The GeneXpert MRSA Assay (FDA     approved for NASAL specimens      only), is one component of a     comprehensive MRSA colonization     surveillance program. It is not     intended to diagnose MRSA     infection nor to guide or     monitor treatment for     MRSA infections.     RESULT CALLED TO, READ BACK BY AND VERIFIED WITH:     KINNEY N AT 0300 ON [id number] BY FORSYTH K     Labs: Basic Metabolic Panel:  Recent Labs Lab 04/25/13 2007 04/26/13 0131 04/26/13 0716 04/27/13 0450  NA 138  --  142 138  K 3.5  --  3.6 3.4*  CL 103  --  108 103  CO2 23  --  23 24  GLUCOSE 89  --  100* 127*  BUN 22  --  18 13  CREATININE 2.36*  --  1.98* 1.83*  CALCIUM 8.9  --  8.9 8.8  MG  --  1.7  --   --    Liver Function Tests:  Recent Labs Lab 04/25/13 2007 04/26/13 0716  AST 15 14  ALT 9 8  ALKPHOS 38* 34*  BILITOT 0.2* 1.6*  PROT 7.0 6.1  ALBUMIN 3.9 3.4*    Recent Labs Lab 04/25/13 2007  LIPASE 6*   CBC:  Recent Labs Lab 04/25/13 2007 04/26/13 0716 04/27/13 0450 04/28/13 0527  WBC 4.9 3.6* 3.5* 4.4  NEUTROABS 3.2  --   --   --   HGB 7.7* 8.3* 8.2* 8.7*  HCT 22.8* 24.1* 23.7* 24.9*  MCV 93.1 92.3 91.9 93.6  PLT 166 159 176 180   Cardiac Enzymes:  Recent Labs Lab 04/25/13 2007 04/26/13 0131 04/26/13 0716 04/26/13 1328  TROPONINI <0.30 <0.30 <0.30 <0.30   CBG:  Recent Labs Lab 04/27/13 1217 04/27/13 1645 04/27/13 2042 04/28/13 0740 04/28/13 1138  GLUCAP 127* 182* 128* 91 123*    Principal Problem:   Syncope Active Problems:   Anemia   Hypertension   DM type 2 causing renal disease   Pancreatic adenocarcinoma   Penile cellulitis   Time coordinating discharge: 35 minutes  Signed:  [name], MD [facility] 04/28/2013, 2:54 PM

## 2013-04-28 NOTE — Progress Notes (Signed)
Patient has no complaints. He is looking forward to going home. Biopsy results are not available. Patient's hemoglobin is up to 8.7 g. Patient advised call if he has another episode of melena or rectal bleeding in which case will consider small bowel given capsule study. I will contact patient and colon polyp biopsy results are available.

## 2013-04-28 NOTE — Progress Notes (Signed)
Patient ID: [name], male   DOB: October 12, 1951, 62 y.o.   MRN: [id number] States he feels somewhat better. He tells me he is hungry. He is on carb modified diet.  He has not had a BM since EGD/Colonoscopy. Slight increase in his Hemoglobin from 8.2 to 8.7. Underwent EGD/Colonoscopy yesterday by Dr. Karilyn Cota which revealed Impression:  EGD findings;  Small sliding hiatal hernia.  Few small hyperplastic appearing polyps in gastric body which were left alone.  Few small scattered telangiectasia at antrum without stigmata of bleeding.  Colonoscopy findings;  Examination performed to cecum.  Three small polyps removed; two of these were removed via cold biopsy and submitted together(6 cecum and descending colon). Third polyp was cold snared from rectosigmoid.  Incidental finding of small submucosal lipoma at ascending colon.  Small external hemorrhoids.  Not convinced that patient bled from gastric telangiectasia.  If bleeding recurs will consider small bowel given capsule study (Dr Karilyn Cota).  Hx of DVT and on Coumadin prior to admission.  Filed Vitals:   04/27/13 2213 04/28/13 0500 04/28/13 0502 04/28/13 0504  BP: 98/65 112/68 96/62 104/64  Pulse: 101 77 82 86  Temp:  98.4 F (36.9 C)    TempSrc:  Oral    Resp:      Height:      Weight:      SpO2:  97%     CBC    Component Value Date/Time   WBC 4.4 04/28/2013 0527   RBC 2.66* 04/28/2013 0527   HGB 8.7* 04/28/2013 0527   HCT 24.9* 04/28/2013 0527   PLT 180 04/28/2013 0527   MCV 93.6 04/28/2013 0527   MCH 32.7 04/28/2013 0527   MCHC 34.9 04/28/2013 0527   RDW 13.1 04/28/2013 0527   LYMPHSABS 1.0 04/25/2013 2007   MONOABS 0.6 04/25/2013 2007   EOSABS 0.2 04/25/2013 2007   BASOSABS 0.0 04/25/2013 2007   We will continue to monitor H and H.

## 2013-04-28 NOTE — Evaluation (Signed)
Physical Therapy Evaluation Patient Details Name: [name] MRN: [id number] DOB: 03/12/1951 Today's Date: 04/28/2013 Time: 4098-1191 PT Time Calculation (min): 43 min  PT Assessment / Plan / Recommendation Clinical Impression  Pt is seen for evaluation and found to be at functional baseline...his problems are more social, as he doesn't want to live with his wife but doesn't earn enough money to get his own apartment.  We cannot help with this    PT Assessment  Patent does not need any further PT services    Follow Up Recommendations  No PT follow up    Does the patient have the potential to tolerate intense rehabilitation      Barriers to Discharge        Equipment Recommendations  None recommended by PT    Recommendations for Other Services     Frequency      Precautions / Restrictions Precautions Precaution Comments: contact precautions Restrictions Weight Bearing Restrictions: No   Pertinent Vitals/Pain       Mobility  Bed Mobility Bed Mobility: Sit to Supine;Supine to Sit Supine to Sit: 7: Independent;HOB flat Transfers Transfers: Sit to Stand;Stand to Sit Sit to Stand: 7: Independent;From bed;With upper extremity assist Stand to Sit: 7: Independent;To chair/3-in-1;With upper extremity assist Ambulation/Gait Ambulation/Gait Assistance: 7: Independent Ambulation Distance (Feet): 175 Feet Assistive device: None Gait Pattern: Within Functional Limits General Gait Details: pt does curl his toes at heel strike, he states because it hurts his heels to walk barefoot...this should resolve with shoes Stairs: No Wheelchair Mobility Wheelchair Mobility: No    Exercises     PT Diagnosis:    PT Problem List:   PT Treatment Interventions:     PT Goals    Visit Information  Last PT Received On: 04/28/13    Subjective Data  Subjective: I just can't live with my wife anymore Patient Stated Goal: none stated   Prior Functioning  Home Living Lives With:  Spouse (wife will not assist pt per his report) Available Help at Discharge: Family;Available PRN/intermittently Type of Home: Mobile home Home Access: Ramped entrance Home Layout: One level Bathroom Shower/Tub: Engineer, manufacturing systems: Standard Home Adaptive Equipment: Environmental consultant - rolling;Straight cane Prior Function Level of Independence: Independent Able to Take Stairs?: Yes Driving: Yes Vocation: On disability Communication Communication: No difficulties    Cognition  Cognition Arousal/Alertness: Awake/alert Behavior During Therapy: WFL for tasks assessed/performed Overall Cognitive Status: Within Functional Limits for tasks assessed    Extremity/Trunk Assessment Right Lower Extremity Assessment RLE ROM/Strength/Tone: Within functional levels RLE Sensation: WFL - Light Touch RLE Coordination: WFL - gross motor Left Lower Extremity Assessment LLE ROM/Strength/Tone: Within functional levels LLE Sensation: WFL - Light Touch LLE Coordination: WFL - gross motor Trunk Assessment Trunk Assessment: Normal   Balance    End of Session PT - End of Session Equipment Utilized During Treatment: Gait belt Activity Tolerance: Patient tolerated treatment well Patient left: in chair;with call bell/phone within reach Nurse Communication: Mobility status  GP     Konrad Penta 04/28/2013, 11:55 AM

## 2013-04-28 NOTE — Progress Notes (Signed)
04/28/13 1423 Patient had small stool this afternoon, dark brown in color. No blood noted to stool. Unable to send for hemoccult since mixed with urine. Notified Dr Irene Limbo. Earnstine Regal, RN

## 2013-04-28 NOTE — Progress Notes (Signed)
04/28/13 1655 Reviewed discharge instructions with patient via teachback. Given copy of instructions, medication list, prescriptions, and f/u information. Patient to follow-up with Dr. Karilyn Cota in 2 weeks as instructed. Discussed when to call MD. Pt verbalizes understanding. IV sites d/c'd and within normal limits. No c/o pain or discomfort at time of discharge. Pt left floor in stable condition via w/c accompanied by nurse tech. Morrie Sheldon Daleisa Halperin,RN

## 2013-05-02 ENCOUNTER — Telehealth (INDEPENDENT_AMBULATORY_CARE_PROVIDER_SITE_OTHER): Payer: Self-pay | Admitting: *Deleted

## 2013-05-02 ENCOUNTER — Encounter (INDEPENDENT_AMBULATORY_CARE_PROVIDER_SITE_OTHER): Payer: Self-pay | Admitting: *Deleted

## 2013-05-02 NOTE — Telephone Encounter (Signed)
Returning Dr. Karilyn Cota call for results. The return phone number is [phone].

## 2013-05-03 NOTE — Progress Notes (Signed)
Apt has been scheduled for 05/25/13 at 2:30 pm with Dorene Ar, NP.

## 2013-05-04 NOTE — Telephone Encounter (Signed)
Notes Recorded by Shona Needles, LPN on 02/23/8118 at 2:22 PM Patient Called the office and he was given the results of his Colonoscopy. He has been given an OV and will be made aware when it is time for him to have his next Colonoscopy.

## 2013-05-09 ENCOUNTER — Encounter (INDEPENDENT_AMBULATORY_CARE_PROVIDER_SITE_OTHER): Payer: Self-pay | Admitting: *Deleted

## 2013-05-25 ENCOUNTER — Ambulatory Visit (INDEPENDENT_AMBULATORY_CARE_PROVIDER_SITE_OTHER): Payer: Medicare Other | Admitting: Internal Medicine

## 2013-05-25 ENCOUNTER — Encounter (INDEPENDENT_AMBULATORY_CARE_PROVIDER_SITE_OTHER): Payer: Self-pay | Admitting: Internal Medicine

## 2013-05-25 VITALS — BP 132/62 | HR 76 | Temp 98.7°F | Ht 72.0 in | Wt 173.8 lb

## 2013-05-25 DIAGNOSIS — D649 Anemia, unspecified: Secondary | ICD-10-CM

## 2013-05-25 DIAGNOSIS — K921 Melena: Secondary | ICD-10-CM | POA: Insufficient documentation

## 2013-05-25 NOTE — Patient Instructions (Addendum)
CBC today. Iron studies. OV in 3 months. Further recommendations to follow.

## 2013-05-25 NOTE — Progress Notes (Signed)
Subjective:     Patient ID: [name], male   DOB: Mar 08, 1951, 62 y.o.   MRN: [id number]  HPI Here today for f/u after recently undergoing an EGD/Colonoscopy in June for anemia and melena. Admitted to [facility] with a hemoglobin of 7.7. He received 2 units of PRBCs.  Appetite is good. He has actually gained 3 pounds. He does c/o some bloating.  There is no abdominal pain. He has nausea from time to time. He has a BM daily. Black probably from the iron he takes daily. Hx of pancreatic cancer with hx of resection in 2011. 04/28/2013 8.7 post transfusion  04/25/2013 EGD/Colonoscopy:  Small sliding hiatal hernia.  Few small hyperplastic appearing polyps in gastric body which were left alone.  Few small scattered telangiectasia at antrum without stigmata of bleeding.  Colonoscopy findings;  Examination performed to cecum.  Three small polyps removed; two of these were removed via cold biopsy and submitted together(6 cecum and descending colon). Third polyp was cold snared from rectosigmoid.  Incidental finding of small submucosal lipoma at ascending colon.  Small external hemorrhoids.  Not convinced that patient bled from gastric telangiectasia.  Biopsy: Tubular adenoma. Next colonoscopy in 5 yrs. If bleeding recurs will consider small bowel given capsule study.  10/21/2012 Iron 39, TIBC 261, Sat 15, UIBC 222, ferritin 620, folate 18, vitamin B12 1009   Review of Systems see hpi Current Outpatient Prescriptions  Medication Sig Dispense Refill  . amitriptyline (ELAVIL) 10 MG tablet Take 10 mg by mouth 2 (two) times daily.      Marland Kitchen docusate sodium (COLACE) 100 MG capsule Take 100 mg by mouth daily as needed for constipation.      . ferrous sulfate 325 (65 FE) MG EC tablet Take 325 mg by mouth daily.      Marland Kitchen gabapentin (NEURONTIN) 100 MG capsule Take 100 mg by mouth 2 (two) times daily.      Marland Kitchen glipiZIDE (GLUCOTROL) 5 MG tablet Take 5 mg by mouth daily.      . magnesium 30 MG tablet Take by  mouth 2 (two) times daily.      . metoprolol tartrate (LOPRESSOR) 25 MG tablet Take 12.5 mg by mouth 2 (two) times daily.      . ondansetron (ZOFRAN-ODT) 8 MG disintegrating tablet Take 8 mg by mouth every 8 (eight) hours as needed for nausea.      . Pancrelipase, Lip-Prot-Amyl, (ZENPEP) 10000 UNITS CPEP Take 3 capsules by mouth 3 (three) times daily.       . pantoprazole (PROTONIX) 40 MG tablet Take 1 tablet (40 mg total) by mouth daily.  30 tablet  1  . vitamin B-12 (CYANOCOBALAMIN) 1000 MCG tablet Take 1,000 mcg by mouth daily.       No current facility-administered medications for this visit.   Past Medical History  Diagnosis Date  . Hypertension   . Diabetes mellitus, type II 2010  . Pancreatic adenocarcinoma 2011    Status post resection and chemotherapy; weight loss and malnutrition  . Depression   . Biliary obstruction 2012    Biliary stent replaced in 2012 secondary to reobstruction  . GERD (gastroesophageal reflux disease)   . Anemia   . 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 Cataracts, bilateral   . GI bleed   . Arachnoid cyst    Past Surgical History  Procedure Laterality Date  . Pancreatic resection  2011  . Ercp w/ plastic stent placement  2012  Dr. Karilyn Cota; stent replaced  . Esophagogastroduodenoscopy  10/23/2012    Procedure: ESOPHAGOGASTRODUODENOSCOPY (EGD);  Surgeon: Malissa Hippo, MD;  [facility];  Service: Endoscopy;  Laterality: N/A;  . Appendectomy    . Whipple procedure    . Colonoscopy with esophagogastroduodenoscopy (egd) N/A 04/27/2013    Procedure: COLONOSCOPY WITH ESOPHAGOGASTRODUODENOSCOPY (EGD);  Surgeon: Malissa Hippo, MD;  Location: [facility];  Service: Endoscopy;  Laterality: N/A;   No Known Allergies       Objective:   Physical Exam  Filed Vitals:   05/25/13 1429  BP: 132/62  Pulse: 76  Temp: 98.7 F (37.1 C)  Height: 6' (1.829 m)  Weight: 173 lb 12.8 oz (78.835 kg)   Alert and  oriented. Skin warm and dry. Oral mucosa is moist.   . Sclera anicteric, conjunctivae is pink. Thyroid not enlarged. No cervical lymphadenopathy. Lungs clear. Heart regular rate and rhythm.  Abdomen is soft. Bowel sounds are positive. No hepatomegaly. No abdominal masses felt. No tenderness.  No edema to lower extremities.Stool dark and guaiac positive     Assessment:     Hx anemia.. Source of bleeding not found. Hx of melena . I discussed this case with Dr. Karilyn Cota.      Plan:     CBC and iron studies today. OV in 3 months. Possible Givens Capsule.

## 2013-05-26 LAB — IRON AND TIBC
%SAT: 20 % (ref 20–55)
Iron: 53 ug/dL (ref 42–165)
TIBC: 267 ug/dL (ref 215–435)

## 2013-05-26 LAB — CBC WITH DIFFERENTIAL/PLATELET
HCT: 26.1 % — ABNORMAL LOW (ref 39.0–52.0)
Hemoglobin: 8.8 g/dL — ABNORMAL LOW (ref 13.0–17.0)
Lymphs Abs: 0.9 10*3/uL (ref 0.7–4.0)
MCH: 30.8 pg (ref 26.0–34.0)
Monocytes Relative: 9 % (ref 3–12)
Neutro Abs: 3.9 10*3/uL (ref 1.7–7.7)
Neutrophils Relative %: 69 % (ref 43–77)
RBC: 2.86 MIL/uL — ABNORMAL LOW (ref 4.22–5.81)

## 2013-05-26 LAB — FERRITIN: Ferritin: 449 ng/mL — ABNORMAL HIGH (ref 22–322)

## 2013-06-06 ENCOUNTER — Telehealth (INDEPENDENT_AMBULATORY_CARE_PROVIDER_SITE_OTHER): Payer: Self-pay | Admitting: *Deleted

## 2013-06-06 DIAGNOSIS — D649 Anemia, unspecified: Secondary | ICD-10-CM

## 2013-06-06 NOTE — Telephone Encounter (Signed)
Error This number has been disconnected. We will send him a letter. (Tammy). Will also get a hemoccult on him.

## 2013-06-06 NOTE — Telephone Encounter (Signed)
.  Per Terri Setzer,NP in 4 weeks. 

## 2013-06-16 ENCOUNTER — Encounter (INDEPENDENT_AMBULATORY_CARE_PROVIDER_SITE_OTHER): Payer: Self-pay | Admitting: *Deleted

## 2013-06-16 ENCOUNTER — Other Ambulatory Visit (INDEPENDENT_AMBULATORY_CARE_PROVIDER_SITE_OTHER): Payer: Self-pay | Admitting: *Deleted

## 2013-06-16 DIAGNOSIS — D649 Anemia, unspecified: Secondary | ICD-10-CM

## 2013-06-23 ENCOUNTER — Encounter (INDEPENDENT_AMBULATORY_CARE_PROVIDER_SITE_OTHER): Payer: Self-pay | Admitting: *Deleted

## 2013-08-25 ENCOUNTER — Encounter (INDEPENDENT_AMBULATORY_CARE_PROVIDER_SITE_OTHER): Payer: Medicare Other | Admitting: Internal Medicine

## 2013-08-25 NOTE — Progress Notes (Signed)
Patient ID: [name], male   DOB: [birth year], 62 y.o.   MRN: [id number] Opened in error

## 2013-08-25 NOTE — Progress Notes (Deleted)
Subjective:     Patient ID: [name], male   DOB: October 08, 1951, 62 y.o.   MRN: [id number]  HPI Here today for f/u. Her was last seen in July after undergoing an EGD/Colonosocpy in June for anemia and melena.  Admitted to [facility] with a hemoglobin of 7.7. He received 2 units of PRBCs. Hx of pancreatic cancer with hx of resection in 2012. He sees Dr. [name] at [facility] for follow ups.       04/25/2013 EGD/Colonoscopy:  Small sliding hiatal hernia.  Few small hyperplastic appearing polyps in gastric body which were left alone.  Few small scattered telangiectasia at antrum without stigmata of bleeding.  Colonoscopy findings;  Examination performed to cecum.  Three small polyps removed; two of these were removed via cold biopsy and submitted together(6 cecum and descending colon). Third polyp was cold snared from rectosigmoid.  Incidental finding of small submucosal lipoma at ascending colon.  Small external hemorrhoids.  Not convinced that patient bled from gastric telangiectasia.  Biopsy: Tubular adenoma. Next colonoscopy in 5 yrs.  If bleeding recurs will consider small bowel given capsule study.  10/21/2012 Iron 39, TIBC 261, Sat 15, UIBC 222, ferritin 620, folate 18, vitamin B12 1009      EGD 10/2012: Impression:  Small sliding hiatal hernia.  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  Patent pylorus with wide-open anastomosis and normal-appearing afferent and efferent jejunal loops.   Last colonoscopy was 3 yrs ago at [facility] and he reports that it was not complete.      CBC    Component Value Date/Time   WBC 5.6 05/25/2013 1445   RBC 2.86* 05/25/2013 1445   RBC 2.65* 10/20/2012 1857   HGB 8.8* 05/25/2013 1445   HCT 26.1* 05/25/2013 1445   PLT 229 05/25/2013 1445   MCV 91.3 05/25/2013 1445   MCH 30.8 05/25/2013 1445   MCHC 33.7 05/25/2013 1445   RDW 14.4 05/25/2013 1445   LYMPHSABS 0.9 05/25/2013 1445   MONOABS 0.5 05/25/2013 1445   EOSABS 0.2 05/25/2013  1445   BASOSABS 0.0 05/25/2013 1445       Review of Systems     Objective:   Physical Exam     Assessment:     ***    Plan:     ***      This encounter was created in error - please disregard. This encounter was created in error - please disregard.

## 2014-05-30 ENCOUNTER — Other Ambulatory Visit: Payer: Self-pay | Admitting: Orthopedic Surgery

## 2014-05-30 DIAGNOSIS — M25521 Pain in right elbow: Secondary | ICD-10-CM

## 2014-05-31 ENCOUNTER — Ambulatory Visit
Admission: RE | Admit: 2014-05-31 | Discharge: 2014-05-31 | Disposition: A | Discharge status: Home / Self Care | Payer: Medicare Other | Source: Ambulatory Visit | Attending: Orthopedic Surgery | Admitting: Orthopedic Surgery

## 2014-05-31 DIAGNOSIS — M25521 Pain in right elbow: Secondary | ICD-10-CM

## 2014-07-13 ENCOUNTER — Encounter (HOSPITAL_COMMUNITY): Payer: Self-pay | Admitting: Pharmacy Technician

## 2014-07-18 ENCOUNTER — Encounter (HOSPITAL_COMMUNITY)
Admission: RE | Admit: 2014-07-18 | Discharge: 2014-07-18 | Disposition: A | Discharge status: Home / Self Care | Payer: Medicare Other | Source: Ambulatory Visit | Attending: Orthopedic Surgery | Admitting: Orthopedic Surgery

## 2014-07-18 ENCOUNTER — Encounter (HOSPITAL_COMMUNITY)
Admission: RE | Admit: 2014-07-18 | Discharge: 2014-07-18 | Disposition: A | Discharge status: Home / Self Care | Payer: Medicare Other | Source: Ambulatory Visit | Attending: Anesthesiology | Admitting: Anesthesiology

## 2014-07-18 ENCOUNTER — Encounter (HOSPITAL_COMMUNITY): Payer: Self-pay

## 2014-07-18 DIAGNOSIS — Z87442 Personal history of urinary calculi: Secondary | ICD-10-CM | POA: Diagnosis not present

## 2014-07-18 DIAGNOSIS — M24029 Loose body in unspecified elbow: Secondary | ICD-10-CM | POA: Diagnosis not present

## 2014-07-18 DIAGNOSIS — R0602 Shortness of breath: Secondary | ICD-10-CM | POA: Diagnosis not present

## 2014-07-18 DIAGNOSIS — N189 Chronic kidney disease, unspecified: Secondary | ICD-10-CM | POA: Diagnosis not present

## 2014-07-18 DIAGNOSIS — F411 Generalized anxiety disorder: Secondary | ICD-10-CM | POA: Diagnosis not present

## 2014-07-18 DIAGNOSIS — F3289 Other specified depressive episodes: Secondary | ICD-10-CM | POA: Diagnosis not present

## 2014-07-18 DIAGNOSIS — Z01818 Encounter for other preprocedural examination: Secondary | ICD-10-CM | POA: Diagnosis not present

## 2014-07-18 DIAGNOSIS — X58XXXA Exposure to other specified factors, initial encounter: Secondary | ICD-10-CM | POA: Diagnosis not present

## 2014-07-18 DIAGNOSIS — K219 Gastro-esophageal reflux disease without esophagitis: Secondary | ICD-10-CM | POA: Diagnosis not present

## 2014-07-18 DIAGNOSIS — S42309S Unspecified fracture of shaft of humerus, unspecified arm, sequela: Secondary | ICD-10-CM | POA: Diagnosis not present

## 2014-07-18 DIAGNOSIS — F329 Major depressive disorder, single episode, unspecified: Secondary | ICD-10-CM | POA: Diagnosis not present

## 2014-07-18 DIAGNOSIS — E119 Type 2 diabetes mellitus without complications: Secondary | ICD-10-CM | POA: Diagnosis not present

## 2014-07-18 DIAGNOSIS — IMO0002 Reserved for concepts with insufficient information to code with codable children: Secondary | ICD-10-CM | POA: Diagnosis not present

## 2014-07-18 DIAGNOSIS — K831 Obstruction of bile duct: Secondary | ICD-10-CM | POA: Diagnosis not present

## 2014-07-18 DIAGNOSIS — I129 Hypertensive chronic kidney disease with stage 1 through stage 4 chronic kidney disease, or unspecified chronic kidney disease: Secondary | ICD-10-CM | POA: Diagnosis not present

## 2014-07-18 HISTORY — DX: Chronic kidney disease, unspecified: N18.9

## 2014-07-18 HISTORY — DX: Personal history of other medical treatment: Z92.89

## 2014-07-18 HISTORY — DX: Shortness of breath: R06.02

## 2014-07-18 HISTORY — DX: Personal history of urinary calculi: Z87.442

## 2014-07-18 HISTORY — DX: Other pulmonary embolism without acute cor pulmonale: I26.99

## 2014-07-18 HISTORY — DX: Anxiety disorder, unspecified: F41.9

## 2014-07-18 LAB — SURGICAL PCR SCREEN
MRSA, PCR: NEGATIVE
Staphylococcus aureus: POSITIVE — AB

## 2014-07-18 NOTE — Progress Notes (Signed)
I called a prescription for Mupirocin ointment to [facility] , [place], [place].

## 2014-07-18 NOTE — Progress Notes (Signed)
Anesthesia PAT Evaluation: Patient is a 63 year old male scheduled for right elbow radial head replacement joint arthrotomy debridement on 07/26/14 by Dr. Caralyn Guile.  History includes pancreatic adenocarcinoma s/p Whipple and chemoradiation 2011 [facility]; followed by Dr. Birdie Sons), biliary stent '12, DM2, HTN, anemia on daily oral iron, CKD stage IV, depression, anxiety, question of PE '11, small arachnoid cyst in the posterior fossa by head CT 04/25/13, GI bleed, cataracts, normal LV systolic function with no regional wall motion abnormalities by 04/26/13 echo. PCP Dr. Shawn Stall with [facility]. Nephrologist is Dr. Veva Holes who cleared patient from a renal standpoint (since his Cr was back down to 3.5) with recommendations to monitor BMP pre and post-operatively, avoid NSAIDS, avoid hypotension, avoid potential nephrotoxins. Dr. Shon Millet is referring him to a hematologist at [facility] for further evaluation of his anemia, but appointment is not until 08/2014.  Exam: VVS, BP 131/74, HR 74 bpm. Skin pale, but no acute distress.  Heart RRR, no murmur noted. Lungs clear.  1+ pretibial edema.  He denied chest pain, SOB.  He gets occasional dizziness with activity or standing, but no consistently and he doesn't feel symptoms are particularly bothersome.  No syncope. No new symptoms since he was last seen by his nephrologist.   EKG on 07/18/14 showed: NSR, LAD (consider LAFB), LVH with QRS widening. Overall, I think his EKG is stable when compared to racing from 04/25/13.  Echo on 04/26/13 showed:  - Left ventricle: The cavity size was normal. Wall thickness was increased in a pattern of mild LVH. There was mild focal basal hypertrophy of the septum. Systolic function was normal. The estimated ejection fraction was in the range of 50% to 55%. Wall motion was normal; there were no regional wall motion abnormalities. - Aortic valve: Mildly calcified annulus. Trileaflet; normal thickness leaflets. - Atrial septum: No  defect or patent foramen ovale was identified.  CT scan of the abd/pelvis on 06/23/14 (Care Everywhere) showed: Postsurgical changes from Whipple procedure are again noted. No evidence of recurrent tumor or disease progression within limits of this unenhanced examination.  CXR on 07/18/14 showed: No active cardiopulmonary disease.  Labs: A1C on 06/08/14 was 5.4. 07/18/2014 labs from [facility] noted.  WBC 5.31, H/H 7.7/23.5 (previously 7.9/22.8 on 06/11/14; I believe after a transfusion on 06/11/14 for HGB on 6.7), PLT 163. BUN 40, Cr 3.3 (previously 3.2-3.6 since 06/09/14; 5.1 on 06/08/14), Na 142, K 4.8, glucose 184. H/H and Cr results reviewed with Dr. Caralyn Guile.  He is already aware of known CKD and felt that since anemia was chronic and patient denied any significant associated symptoms that it would be okay to proceed with surgery from his standpoint. He requested a T&S be done as a precaution, but was planning to use a tourniquet so he did not anticipate any significant blood loss. I also reviewed his labs with anesthesiologist Dr. Ermalene Postin. He had no additional orders beyond a T&S and felt regional anesthesia could be an option for this patient. (Patient had a specimen drawn today, but because he underwent transfusion within the past 45 days, he will need a formal T&S on the day of surgery.)   Patient will be further evaluated by his assigned anesthesiologist on the day of surgery. He will be getting a formal T&S on arrival, so I'll go ahead and order an ISTAT8 just to make sure that there hasn't been any acute changes in his H/H and Cr. A decision to transfuse can be made at  that time.    George Hugh [facility] Short Stay Center/Anesthesiology Phone [phone] 07/18/2014 7:21 PM

## 2014-07-18 NOTE — Pre-Procedure Instructions (Signed)
[name]  07/18/2014   Your procedure is scheduled on:  07/26/14  Report to [facility] short stay admitting at 1200 PM.  Call this number if you have problems the morning of surgery: [phone]   Remember:   Do not eat food or drink liquids after midnight.   Take these medicines the morning of surgery with A SIP OF WATER: elavil, wellbutrin, gabapentin, metoprolol, protonix     Take all meds as ordered until day of surgery except as instructed below or per dr    [name] all herbel meds, nsaids (aleve,naproxen,advil,ibuprofen) 5 days prior to surgery starting( 07/21/14) including aspirin, all vitamins, magnesium    NO DIABETIC MEDS DAY OF SURGERY   Do not wear jewelry, make-up or nail polish.  Do not wear lotions, powders, or perfumes. You may wear deodorant.  Do not shave 48 hours prior to surgery. Men may shave face and neck.  Do not bring valuables to the hospital.  [facility] is not responsible                  for any belongings or valuables.               Contacts, dentures or bridgework may not be worn into surgery.  Leave suitcase in the car. After surgery it may be brought to your room.  For patients admitted to the hospital, discharge time is determined by your                treatment team.               Patients discharged the day of surgery will not be allowed to drive  home.  Name and phone number of your driver:   Special Instructions:  Special Instructions: [place] - Preparing for Surgery  Before surgery, you can play an important role.  Because skin is not sterile, your skin needs to be as free of germs as possible.  You can reduce the number of germs on you skin by washing with CHG (chlorahexidine gluconate) soap before surgery.  CHG is an antiseptic cleaner which kills germs and bonds with the skin to continue killing germs even after washing.  Please DO NOT use if you have an allergy to CHG or antibacterial soaps.  If your skin becomes reddened/irritated stop  using the CHG and inform your nurse when you arrive at Short Stay.  Do not shave (including legs and underarms) for at least 48 hours prior to the first CHG shower.  You may shave your face.  Please follow these instructions carefully:   1.  Shower with CHG Soap the night before surgery and the morning of Surgery.  2.  If you choose to wash your hair, wash your hair first as usual with your normal shampoo.  3.  After you shampoo, rinse your hair and body thoroughly to remove the Shampoo.  4.  Use CHG as you would any other liquid soap.  You can apply chg directly  to the skin and wash gently with scrungie or a clean washcloth.  5.  Apply the CHG Soap to your body ONLY FROM THE NECK DOWN.  Do not use on open wounds or open sores.  Avoid contact with your eyes ears, mouth and genitals (private parts).  Wash genitals (private parts)       with your normal soap.  6.  Wash thoroughly, paying special attention to the area where your surgery will be performed.  7.  Thoroughly rinse your body with warm water from the neck down.  8.  DO NOT shower/wash with your normal soap after using and rinsing off the CHG Soap.  9.  Pat yourself dry with a clean towel.            10.  Wear clean pajamas.            11.  Place clean sheets on your bed the night of your first shower and do not sleep with pets.  Day of Surgery  Do not apply any lotions/deodorants the morning of surgery.  Please wear clean clothes to the hospital/surgery center.   Please read over the following fact sheets that you were given: Pain Booklet, Coughing and Deep Breathing and Surgical Site Infection Prevention

## 2014-07-25 NOTE — H&P (Signed)
[name] is an 63 y.o. male.   Chief Complaint: Right elbow pain HPI: Pt followed in office, sustained right elbow proximal radius fracture, Pt with malunion and displaced comminuted radial head fracture Pt here for surgery on right elbow  Pt has undergone decompression of ulnar nerve on right elbow  Past Medical History  Diagnosis Date  . Hypertension   . Diabetes mellitus, type II 2010  . Pancreatic adenocarcinoma 2011    Status post resection and chemotherapy; weight loss and malnutrition  . Depression   . Biliary obstruction 2012    Biliary stent replaced in 2012 secondary to reobstruction  . GERD (gastroesophageal reflux disease)   . Anemia   . 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 Cataracts, bilateral   . GI bleed   . Arachnoid cyst   . Chronic kidney disease     CRF  . Shortness of breath     with exertion  . Anxiety   . History of kidney stones   . History of blood transfusion   . PE (pulmonary embolism) 2011    "I think."  'I was on coumadin for a while, not sure how long or what MD."    Past Surgical History  Procedure Laterality Date  . Pancreatic resection  2011  . Ercp w/ plastic stent placement  2012    Dr. Laural Golden; stent replaced  . Esophagogastroduodenoscopy  10/23/2012    Procedure: ESOPHAGOGASTRODUODENOSCOPY (EGD);  Surgeon: Rogene Houston, MD;  [facility];  Service: Endoscopy;  Laterality: N/A;  . Appendectomy    . Whipple procedure    . Colonoscopy with esophagogastroduodenoscopy (egd) N/A 04/27/2013    Procedure: COLONOSCOPY WITH ESOPHAGOGASTRODUODENOSCOPY (EGD);  Surgeon: Rogene Houston, MD;  [facility]: [facility];  Service: Endoscopy;  Laterality: N/A;  . Cholecystectomy    . Eye surgery Bilateral   . Elbow surgery      due to fracture    Family History  Problem Relation Age of Onset  .  Coronary artery disease Mother 27  . Heart attack Father 31  . COPD Brother   . Diabetes Mellitus I Daughter    Social History:  reports that he has never smoked. He has never used smokeless tobacco. He reports that he does not drink alcohol or use illicit drugs.  Allergies:  Allergies  Allergen Reactions  . Peanut-Containing Drug Products Other (See Comments)    unknown    No prescriptions prior to admission    No results found for this or any previous visit (from the past 48 hour(s)). No results found.  ROS Pt followed by nephrology for poor kidney function and low hemoglobin  There were no vitals taken for this visit. Physical Exam  General Appearance:  Alert, cooperative, no distress, appears stated age  Head:  Normocephalic, without obvious abnormality, atraumatic  Eyes:  Pupils equal, conjunctiva/corneas clear,         Throat: Lips, mucosa, and tongue normal; teeth and gums normal  Neck: No visible masses     Lungs:   respirations unlabored  Chest Wall:  No tenderness or deformity  Heart:  Regular rate and rhythm,  Abdomen:   Soft, non-tender,         Extremities: Right elbow: skin intact fingers warm well perfused Good elbow motion with pain over lateral aspect of elbow, Fingers warm well perfused Able to  extend thumb and digits  Pulses: 2+ and symmetric  Skin: Skin color, texture, turgor normal, no rashes or lesions     Neurologic: Normal    Assessment/Plan Right proximal radius malunion, comminuted radial head fracture/malunion  Right proximal radius head resection and open right radial head arthroplasty  R/B/A DISCUSSED WITH PT IN OFFICE.  PT VOICED UNDERSTANDING OF PLAN CONSENT SIGNED DAY OF SURGERY PT SEEN AND EXAMINED PRIOR TO OPERATIVE PROCEDURE/DAY OF SURGERY SITE MARKED. QUESTIONS ANSWERED WILL BE ADMITTED OBSERVATION FOLLOWING SURGERY WE ARE PLANNING SURGERY FOR YOUR UPPER EXTREMITY. THE RISKS AND BENEFITS OF SURGERY INCLUDE BUT NOT LIMITED  TO BLEEDING INFECTION, DAMAGE TO NEARBY NERVES ARTERIES TENDONS, FAILURE OF SURGERY TO ACCOMPLISH ITS INTENDED GOALS, PERSISTENT SYMPTOMS AND NEED FOR FURTHER SURGICAL INTERVENTION. WITH THIS IN MIND WE WILL PROCEED. I HAVE DISCUSSED WITH THE PATIENT THE PRE AND POSTOPERATIVE REGIMEN AND THE DOS AND DON'TS. PT VOICED UNDERSTANDING AND INFORMED CONSENT SIGNED.  Linna Hoff 07/26/2014 At 1302

## 2014-07-25 NOTE — Brief Op Note (Signed)
07/26/2014  8:26 PM  PATIENT:  [name]  63 y.o. male  PRE-OPERATIVE DIAGNOSIS:  RIGHT ELBOW RADIAL HEAD MALUNION  POST-OPERATIVE DIAGNOSIS:   PROCEDURE:  Procedure(s): RIGHT ELBOW RADIAL HEAD REPLACEMENT JOINT ARTHROTOMY DEBRIDEMENT  (Right)  SURGEON:  Surgeon(s) and Role:    * Linna Hoff, MD - Primary  PHYSICIAN ASSISTANT:   ASSISTANTS: none   ANESTHESIA:   general  EBL:     BLOOD ADMINISTERED:none  DRAINS: none   LOCAL MEDICATIONS USED:  MARCAINE     SPECIMEN:  No Specimen  DISPOSITION OF SPECIMEN:  N/A  COUNTS:  YES  TOURNIQUET:  * No tourniquets in log *  DICTATION: .Other Dictation: Dictation Number [id number]  PLAN OF CARE: Admit for overnight observation  PATIENT DISPOSITION:  PACU - hemodynamically stable.   Delay start of Pharmacological VTE agent (>24hrs) due to surgical blood loss or risk of bleeding: not applicable

## 2014-07-25 NOTE — Progress Notes (Signed)
Left message on voicemail to patient to arrive at 1100am.

## 2014-07-26 ENCOUNTER — Ambulatory Visit (HOSPITAL_COMMUNITY)
Admission: RE | Admit: 2014-07-26 | Discharge: 2014-07-27 | Disposition: A | Discharge status: Home / Self Care | Payer: Medicare Other | Source: Ambulatory Visit | Attending: Orthopedic Surgery | Admitting: Orthopedic Surgery

## 2014-07-26 ENCOUNTER — Encounter (HOSPITAL_COMMUNITY)
Admission: RE | Disposition: A | Discharge status: Home / Self Care | Payer: Self-pay | Source: Ambulatory Visit | Attending: Orthopedic Surgery

## 2014-07-26 ENCOUNTER — Encounter (HOSPITAL_COMMUNITY): Payer: Medicare Other | Admitting: Vascular Surgery

## 2014-07-26 ENCOUNTER — Ambulatory Visit (HOSPITAL_COMMUNITY): Payer: Medicare Other | Admitting: Anesthesiology

## 2014-07-26 ENCOUNTER — Encounter (HOSPITAL_COMMUNITY): Payer: Self-pay | Admitting: Anesthesiology

## 2014-07-26 DIAGNOSIS — F3289 Other specified depressive episodes: Secondary | ICD-10-CM | POA: Insufficient documentation

## 2014-07-26 DIAGNOSIS — I129 Hypertensive chronic kidney disease with stage 1 through stage 4 chronic kidney disease, or unspecified chronic kidney disease: Secondary | ICD-10-CM | POA: Insufficient documentation

## 2014-07-26 DIAGNOSIS — M24029 Loose body in unspecified elbow: Secondary | ICD-10-CM | POA: Insufficient documentation

## 2014-07-26 DIAGNOSIS — K219 Gastro-esophageal reflux disease without esophagitis: Secondary | ICD-10-CM | POA: Insufficient documentation

## 2014-07-26 DIAGNOSIS — F411 Generalized anxiety disorder: Secondary | ICD-10-CM | POA: Insufficient documentation

## 2014-07-26 DIAGNOSIS — R0602 Shortness of breath: Secondary | ICD-10-CM | POA: Insufficient documentation

## 2014-07-26 DIAGNOSIS — K831 Obstruction of bile duct: Secondary | ICD-10-CM | POA: Insufficient documentation

## 2014-07-26 DIAGNOSIS — Z87442 Personal history of urinary calculi: Secondary | ICD-10-CM | POA: Insufficient documentation

## 2014-07-26 DIAGNOSIS — N189 Chronic kidney disease, unspecified: Secondary | ICD-10-CM | POA: Insufficient documentation

## 2014-07-26 DIAGNOSIS — E119 Type 2 diabetes mellitus without complications: Secondary | ICD-10-CM | POA: Diagnosis not present

## 2014-07-26 DIAGNOSIS — IMO0002 Reserved for concepts with insufficient information to code with codable children: Secondary | ICD-10-CM | POA: Insufficient documentation

## 2014-07-26 DIAGNOSIS — F329 Major depressive disorder, single episode, unspecified: Secondary | ICD-10-CM | POA: Insufficient documentation

## 2014-07-26 DIAGNOSIS — S52101A Unspecified fracture of upper end of right radius, initial encounter for closed fracture: Secondary | ICD-10-CM | POA: Diagnosis present

## 2014-07-26 HISTORY — PX: RADIAL HEAD ARTHROPLASTY: SHX6044

## 2014-07-26 LAB — GLUCOSE, CAPILLARY
GLUCOSE-CAPILLARY: 126 mg/dL — AB (ref 70–99)
GLUCOSE-CAPILLARY: 172 mg/dL — AB (ref 70–99)
Glucose-Capillary: 99 mg/dL (ref 70–99)
Glucose-Capillary: 102 mg/dL — ABNORMAL HIGH (ref 70–99)
Glucose-Capillary: 115 mg/dL — ABNORMAL HIGH (ref 70–99)

## 2014-07-26 LAB — TYPE AND SCREEN
ABO/RH(D): A NEG
Antibody Screen: NEGATIVE

## 2014-07-26 LAB — POCT I-STAT, CHEM 8
BUN: 36 mg/dL — AB (ref 6–23)
BUN: 38 mg/dL — AB (ref 6–23)
CALCIUM ION: 1.15 mmol/L (ref 1.13–1.30)
CREATININE: 3.4 mg/dL — AB (ref 0.50–1.35)
CREATININE: 3.4 mg/dL — AB (ref 0.50–1.35)
Calcium, Ion: 1.17 mmol/L (ref 1.13–1.30)
Chloride: 111 mEq/L (ref 96–112)
Chloride: 115 mEq/L — ABNORMAL HIGH (ref 96–112)
GLUCOSE: 123 mg/dL — AB (ref 70–99)
Glucose, Bld: 123 mg/dL — ABNORMAL HIGH (ref 70–99)
HCT: 23 % — ABNORMAL LOW (ref 39.0–52.0)
HCT: 25 % — ABNORMAL LOW (ref 39.0–52.0)
Hemoglobin: 7.8 g/dL — ABNORMAL LOW (ref 13.0–17.0)
Hemoglobin: 8.5 g/dL — ABNORMAL LOW (ref 13.0–17.0)
Potassium: 4.4 mEq/L (ref 3.7–5.3)
Potassium: 4.4 mEq/L (ref 3.7–5.3)
SODIUM: 141 meq/L (ref 137–147)
Sodium: 138 mEq/L (ref 137–147)
TCO2: 18 mmol/L (ref 0–100)
TCO2: 19 mmol/L (ref 0–100)

## 2014-07-26 LAB — BASIC METABOLIC PANEL
Anion gap: 15 (ref 5–15)
Anion gap: 13 (ref 5–15)
BUN: 39 mg/dL — ABNORMAL HIGH (ref 6–23)
BUN: 37 mg/dL — ABNORMAL HIGH (ref 6–23)
CO2: 17 meq/L — AB (ref 19–32)
CO2: 19 meq/L (ref 19–32)
CREATININE: 3.21 mg/dL — AB (ref 0.50–1.35)
Calcium: 8.2 mg/dL — ABNORMAL LOW (ref 8.4–10.5)
Calcium: 8.2 mg/dL — ABNORMAL LOW (ref 8.4–10.5)
Chloride: 107 mEq/L (ref 96–112)
Chloride: 108 mEq/L (ref 96–112)
Creatinine, Ser: 2.89 mg/dL — ABNORMAL HIGH (ref 0.50–1.35)
GFR calc Af Amer: 22 mL/min — ABNORMAL LOW (ref >90)
GFR calc Af Amer: 25 mL/min — ABNORMAL LOW (ref >90)
GFR calc non Af Amer: 19 mL/min — ABNORMAL LOW (ref >90)
GFR calc non Af Amer: 22 mL/min — ABNORMAL LOW (ref >90)
GLUCOSE: 114 mg/dL — AB (ref 70–99)
Glucose, Bld: 122 mg/dL — ABNORMAL HIGH (ref 70–99)
Potassium: 4.6 mEq/L (ref 3.7–5.3)
Potassium: 4.7 mEq/L (ref 3.7–5.3)
Sodium: 139 mEq/L (ref 137–147)
Sodium: 140 mEq/L (ref 137–147)

## 2014-07-26 LAB — CBC WITH DIFFERENTIAL/PLATELET
BASOS ABS: 0 10*3/uL (ref 0.0–0.1)
BASOS PCT: 0 % (ref 0–1)
EOS ABS: 0.2 10*3/uL (ref 0.0–0.7)
Eosinophils Relative: 2 % (ref 0–5)
HEMATOCRIT: 23.8 % — AB (ref 39.0–52.0)
Hemoglobin: 7.9 g/dL — ABNORMAL LOW (ref 13.0–17.0)
Lymphocytes Relative: 12 % (ref 12–46)
Lymphs Abs: 1.2 10*3/uL (ref 0.7–4.0)
MCH: 30.5 pg (ref 26.0–34.0)
MCHC: 33.2 g/dL (ref 30.0–36.0)
MCV: 91.9 fL (ref 78.0–100.0)
MONO ABS: 0.8 10*3/uL (ref 0.1–1.0)
MONOS PCT: 8 % (ref 3–12)
Neutro Abs: 7.7 10*3/uL (ref 1.7–7.7)
Neutrophils Relative %: 78 % — ABNORMAL HIGH (ref 43–77)
Platelets: 215 10*3/uL (ref 150–400)
RBC: 2.59 MIL/uL — ABNORMAL LOW (ref 4.22–5.81)
RDW: 15 % (ref 11.5–15.5)
WBC: 9.9 10*3/uL (ref 4.0–10.5)

## 2014-07-26 LAB — URINALYSIS, ROUTINE W REFLEX MICROSCOPIC
Bilirubin Urine: NEGATIVE
Glucose, UA: NEGATIVE mg/dL
HGB URINE DIPSTICK: NEGATIVE
KETONES UR: NEGATIVE mg/dL
Leukocytes, UA: NEGATIVE
Nitrite: NEGATIVE
PROTEIN: NEGATIVE mg/dL
Specific Gravity, Urine: 1.017 (ref 1.005–1.030)
Urobilinogen, UA: 0.2 mg/dL (ref 0.0–1.0)
pH: 5.5 (ref 5.0–8.0)

## 2014-07-26 LAB — CBC
HCT: 23.2 % — ABNORMAL LOW (ref 39.0–52.0)
Hemoglobin: 7.7 g/dL — ABNORMAL LOW (ref 13.0–17.0)
MCH: 31 pg (ref 26.0–34.0)
MCHC: 33.2 g/dL (ref 30.0–36.0)
MCV: 93.5 fL (ref 78.0–100.0)
Platelets: 205 10*3/uL (ref 150–400)
RBC: 2.48 MIL/uL — ABNORMAL LOW (ref 4.22–5.81)
RDW: 14.9 % (ref 11.5–15.5)
WBC: 5.1 10*3/uL (ref 4.0–10.5)

## 2014-07-26 LAB — ABO/RH: ABO/RH(D): A NEG

## 2014-07-26 SURGERY — ARTHROPLASTY, RADIUS, HEAD
Anesthesia: General | Site: Elbow | Laterality: Right

## 2014-07-26 MED ORDER — METHOCARBAMOL 1000 MG/10ML IJ SOLN
500.0000 mg | Freq: Four times a day (QID) | INTRAVENOUS | Status: DC | PRN
  Filled 2014-07-26: qty 5

## 2014-07-26 MED ORDER — ALPRAZOLAM 0.5 MG PO TABS
0.5000 mg | ORAL_TABLET | Freq: Four times a day (QID) | ORAL | Status: DC | PRN
  Administered 2014-07-26 – 2014-07-27 (×2): 0.5 mg via ORAL
  Filled 2014-07-26 (×2): qty 1

## 2014-07-26 MED ORDER — PANCRELIPASE (LIP-PROT-AMYL) 12000-38000 UNITS PO CPEP: 12000.0000 [IU] | ORAL_CAPSULE | Freq: Three times a day (TID) | ORAL | Status: DC

## 2014-07-26 MED ORDER — PANCRELIPASE (LIP-PROT-AMYL) 12000-38000 UNITS PO CPEP
12000.0000 [IU] | ORAL_CAPSULE | Freq: Once | ORAL | Status: AC
  Administered 2014-07-26: 12000 [IU] via ORAL
  Filled 2014-07-26: qty 1

## 2014-07-26 MED ORDER — GLYCOPYRROLATE 0.2 MG/ML IJ SOLN
INTRAMUSCULAR | Status: DC | PRN
  Administered 2014-07-26: 0.6 mg via INTRAVENOUS

## 2014-07-26 MED ORDER — FENTANYL CITRATE 0.05 MG/ML IJ SOLN
INTRAMUSCULAR | Status: AC
  Filled 2014-07-26: qty 5

## 2014-07-26 MED ORDER — CEFUROXIME SODIUM 750 MG IJ SOLR
INTRAMUSCULAR | Status: AC
  Filled 2014-07-26: qty 750

## 2014-07-26 MED ORDER — HYDROCODONE-ACETAMINOPHEN 7.5-325 MG PO TABS
1.0000 | ORAL_TABLET | ORAL | Status: DC | PRN
  Administered 2014-07-27: 1 via ORAL
  Administered 2014-07-27: 2 via ORAL
  Filled 2014-07-26: qty 2
  Filled 2014-07-26: qty 1

## 2014-07-26 MED ORDER — ROCURONIUM BROMIDE 100 MG/10ML IV SOLN
INTRAVENOUS | Status: DC | PRN
  Administered 2014-07-26: 40 mg via INTRAVENOUS

## 2014-07-26 MED ORDER — SODIUM CHLORIDE 0.9 % IV SOLN
INTRAVENOUS | Status: DC | PRN
  Administered 2014-07-26 (×2): via INTRAVENOUS

## 2014-07-26 MED ORDER — MIDAZOLAM HCL 2 MG/2ML IJ SOLN
INTRAMUSCULAR | Status: AC
  Filled 2014-07-26: qty 2

## 2014-07-26 MED ORDER — CEFAZOLIN SODIUM 1-5 GM-% IV SOLN
1.0000 g | Freq: Three times a day (TID) | INTRAVENOUS | Status: DC
  Administered 2014-07-27: 1 g via INTRAVENOUS
  Filled 2014-07-26 (×3): qty 50

## 2014-07-26 MED ORDER — DIPHENHYDRAMINE HCL 25 MG PO CAPS: 25.0000 mg | ORAL_CAPSULE | Freq: Four times a day (QID) | ORAL | Status: DC | PRN

## 2014-07-26 MED ORDER — METHOCARBAMOL 500 MG PO TABS
500.0000 mg | ORAL_TABLET | Freq: Four times a day (QID) | ORAL | Status: DC | PRN
  Administered 2014-07-26 – 2014-07-27 (×2): 500 mg via ORAL
  Filled 2014-07-26 (×2): qty 1

## 2014-07-26 MED ORDER — OXYCODONE HCL 5 MG PO TABS: 5.0000 mg | ORAL_TABLET | ORAL | Status: DC | PRN

## 2014-07-26 MED ORDER — ROCURONIUM BROMIDE 50 MG/5ML IV SOLN
INTRAVENOUS | Status: AC
  Filled 2014-07-26: qty 1

## 2014-07-26 MED ORDER — PROPOFOL 10 MG/ML IV BOLUS
INTRAVENOUS | Status: AC
  Filled 2014-07-26: qty 20

## 2014-07-26 MED ORDER — PROMETHAZINE HCL 25 MG/ML IJ SOLN: 6.2500 mg | INTRAMUSCULAR | Status: DC | PRN

## 2014-07-26 MED ORDER — ARTIFICIAL TEARS OP OINT
TOPICAL_OINTMENT | OPHTHALMIC | Status: AC
  Filled 2014-07-26: qty 3.5

## 2014-07-26 MED ORDER — PROPOFOL 10 MG/ML IV BOLUS
INTRAVENOUS | Status: DC | PRN
  Administered 2014-07-26: 150 mg via INTRAVENOUS

## 2014-07-26 MED ORDER — HYDROMORPHONE HCL PF 1 MG/ML IJ SOLN
0.5000 mg | INTRAMUSCULAR | Status: DC | PRN
  Administered 2014-07-26 – 2014-07-27 (×3): 1 mg via INTRAVENOUS
  Filled 2014-07-26 (×3): qty 1

## 2014-07-26 MED ORDER — VITAMIN C 500 MG PO TABS
1000.0000 mg | ORAL_TABLET | Freq: Every day | ORAL | Status: DC
  Filled 2014-07-26: qty 2

## 2014-07-26 MED ORDER — MIDAZOLAM HCL 5 MG/5ML IJ SOLN
INTRAMUSCULAR | Status: DC | PRN
  Administered 2014-07-26: 1 mg via INTRAVENOUS

## 2014-07-26 MED ORDER — GLYCOPYRROLATE 0.2 MG/ML IJ SOLN
INTRAMUSCULAR | Status: AC
Start: 2014-07-26 — End: 2014-07-26
  Filled 2014-07-26: qty 2

## 2014-07-26 MED ORDER — NEOSTIGMINE METHYLSULFATE 10 MG/10ML IV SOLN
INTRAVENOUS | Status: DC | PRN
  Administered 2014-07-26: 4 mg via INTRAVENOUS

## 2014-07-26 MED ORDER — LACTATED RINGERS IV SOLN: INTRAVENOUS | Status: DC | PRN

## 2014-07-26 MED ORDER — LACTATED RINGERS IV SOLN
INTRAVENOUS | Status: DC
  Administered 2014-07-26 (×2): via INTRAVENOUS

## 2014-07-26 MED ORDER — ONDANSETRON HCL 4 MG/2ML IJ SOLN
INTRAMUSCULAR | Status: AC
  Filled 2014-07-26: qty 2

## 2014-07-26 MED ORDER — NEOSTIGMINE METHYLSULFATE 10 MG/10ML IV SOLN
INTRAVENOUS | Status: AC
  Filled 2014-07-26: qty 1

## 2014-07-26 MED ORDER — PANCRELIPASE (LIP-PROT-AMYL) 12000-38000 UNITS PO CPEP
12000.0000 [IU] | ORAL_CAPSULE | Freq: Three times a day (TID) | ORAL | Status: DC
  Administered 2014-07-27 (×3): 12000 [IU] via ORAL
  Filled 2014-07-26 (×4): qty 1

## 2014-07-26 MED ORDER — ONDANSETRON HCL 4 MG PO TABS
4.0000 mg | ORAL_TABLET | Freq: Four times a day (QID) | ORAL | Status: DC | PRN
Start: 2014-07-26 — End: 2014-07-27

## 2014-07-26 MED ORDER — FENTANYL CITRATE 0.05 MG/ML IJ SOLN
25.0000 ug | INTRAMUSCULAR | Status: DC | PRN
  Administered 2014-07-26: 50 ug via INTRAVENOUS

## 2014-07-26 MED ORDER — FENTANYL CITRATE 0.05 MG/ML IJ SOLN
INTRAMUSCULAR | Status: DC | PRN
  Administered 2014-07-26 (×4): 50 ug via INTRAVENOUS

## 2014-07-26 MED ORDER — ONDANSETRON HCL 4 MG/2ML IJ SOLN
4.0000 mg | Freq: Four times a day (QID) | INTRAMUSCULAR | Status: DC | PRN
  Administered 2014-07-27: 4 mg via INTRAVENOUS
  Filled 2014-07-26: qty 2

## 2014-07-26 MED ORDER — PANCRELIPASE (LIP-PROT-AMYL) 10000 UNITS PO CPEP: 1.0000 | ORAL_CAPSULE | Freq: Three times a day (TID) | ORAL | Status: DC

## 2014-07-26 MED ORDER — ADULT MULTIVITAMIN W/MINERALS CH
1.0000 | ORAL_TABLET | Freq: Every day | ORAL | Status: DC
  Filled 2014-07-26: qty 1

## 2014-07-26 MED ORDER — EPHEDRINE SULFATE 50 MG/ML IJ SOLN
INTRAMUSCULAR | Status: DC | PRN
Start: 2014-07-26 — End: 2014-07-26
  Administered 2014-07-26 (×3): 5 mg via INTRAVENOUS

## 2014-07-26 MED ORDER — CHLORHEXIDINE GLUCONATE 4 % EX LIQD
60.0000 mL | Freq: Once | CUTANEOUS | Status: DC
  Filled 2014-07-26: qty 60

## 2014-07-26 MED ORDER — 0.9 % SODIUM CHLORIDE (POUR BTL) OPTIME
TOPICAL | Status: DC | PRN
  Administered 2014-07-26: 1000 mL

## 2014-07-26 MED ORDER — CEFAZOLIN SODIUM-DEXTROSE 2-3 GM-% IV SOLR
2.0000 g | INTRAVENOUS | Status: AC
  Administered 2014-07-26: 2 g via INTRAVENOUS

## 2014-07-26 MED ORDER — FENTANYL CITRATE 0.05 MG/ML IJ SOLN
INTRAMUSCULAR | Status: AC
  Filled 2014-07-26: qty 2

## 2014-07-26 MED ORDER — CEFAZOLIN SODIUM 1-5 GM-% IV SOLN
1.0000 g | INTRAVENOUS | Status: AC
  Administered 2014-07-26: 1 g via INTRAVENOUS
  Filled 2014-07-26: qty 50

## 2014-07-26 MED ORDER — EPINEPHRINE HCL 1 MG/ML IJ SOLN
INTRAMUSCULAR | Status: AC
  Filled 2014-07-26: qty 1

## 2014-07-26 MED ORDER — BUPIVACAINE HCL (PF) 0.25 % IJ SOLN
INTRAMUSCULAR | Status: AC
  Filled 2014-07-26: qty 30

## 2014-07-26 MED ORDER — ONDANSETRON HCL 4 MG/2ML IJ SOLN
INTRAMUSCULAR | Status: DC | PRN
  Administered 2014-07-26: 4 mg via INTRAVENOUS

## 2014-07-26 MED ORDER — DOCUSATE SODIUM 100 MG PO CAPS
100.0000 mg | ORAL_CAPSULE | Freq: Two times a day (BID) | ORAL | Status: DC
  Administered 2014-07-26 – 2014-07-27 (×2): 100 mg via ORAL
  Filled 2014-07-26 (×3): qty 1

## 2014-07-26 SURGICAL SUPPLY — 68 items
BANDAGE ELASTIC 4 VELCRO ST LF (GAUZE/BANDAGES/DRESSINGS) ×6 IMPLANT
BLADE LONG MED 31MMX9MM (MISCELLANEOUS) ×1
BLADE LONG MED 31X9 (MISCELLANEOUS) ×2 IMPLANT
BLADE SURG 10 STRL SS (BLADE) ×6 IMPLANT
BLADE SURG 15 STRL LF DISP TIS (BLADE) ×2 IMPLANT
BLADE SURG 15 STRL SS (BLADE) ×4
BNDG ESMARK 4X9 LF (GAUZE/BANDAGES/DRESSINGS) ×3 IMPLANT
BNDG GAUZE ELAST 4 BULKY (GAUZE/BANDAGES/DRESSINGS) ×3 IMPLANT
CLOSURE WOUND 1/2 X4 (GAUZE/BANDAGES/DRESSINGS) ×1
CORDS BIPOLAR (ELECTRODE) ×3 IMPLANT
COVER SURGICAL LIGHT HANDLE (MISCELLANEOUS) ×3 IMPLANT
CUFF TOURNIQUET SINGLE 18IN (TOURNIQUET CUFF) ×3 IMPLANT
CUFF TOURNIQUET SINGLE 24IN (TOURNIQUET CUFF) IMPLANT
DRAPE INCISE IOBAN 66X45 STRL (DRAPES) ×3 IMPLANT
DRAPE OEC MINIVIEW 54X84 (DRAPES) ×3 IMPLANT
DRAPE SURG 17X11 SM STRL (DRAPES) ×3 IMPLANT
DRSG ADAPTIC 3X8 NADH LF (GAUZE/BANDAGES/DRESSINGS) ×3 IMPLANT
ELECT REM PT RETURN 9FT ADLT (ELECTROSURGICAL) ×3
ELECTRODE REM PT RTRN 9FT ADLT (ELECTROSURGICAL) ×1 IMPLANT
GAUZE SPONGE 4X4 12PLY STRL (GAUZE/BANDAGES/DRESSINGS) ×3 IMPLANT
GLOVE BIO SURGEON STRL SZ 6 (GLOVE) ×3 IMPLANT
GLOVE BIO SURGEON STRL SZ7 (GLOVE) ×3 IMPLANT
GLOVE BIO SURGEON STRL SZ7.5 (GLOVE) ×3 IMPLANT
GLOVE BIOGEL PI IND STRL 7.5 (GLOVE) ×1 IMPLANT
GLOVE BIOGEL PI IND STRL 8.5 (GLOVE) ×1 IMPLANT
GLOVE BIOGEL PI INDICATOR 7.5 (GLOVE) ×2
GLOVE BIOGEL PI INDICATOR 8.5 (GLOVE) ×2
GLOVE SURG ORTHO 8.0 STRL STRW (GLOVE) ×3 IMPLANT
GLOVE SURG SS PI 6.5 STRL IVOR (GLOVE) ×3 IMPLANT
GLOVE SURG SS PI 7.5 STRL IVOR (GLOVE) ×3 IMPLANT
GOWN STRL REUS W/ TWL LRG LVL3 (GOWN DISPOSABLE) ×3 IMPLANT
GOWN STRL REUS W/ TWL XL LVL3 (GOWN DISPOSABLE) ×1 IMPLANT
GOWN STRL REUS W/TWL LRG LVL3 (GOWN DISPOSABLE) ×6
GOWN STRL REUS W/TWL XL LVL3 (GOWN DISPOSABLE) ×2
HEAD RADIAL 22MM (Head) ×3 IMPLANT
KIT BASIN OR (CUSTOM PROCEDURE TRAY) ×3 IMPLANT
KIT ROOM TURNOVER OR (KITS) ×3 IMPLANT
LOOP VESSEL MAXI BLUE (MISCELLANEOUS) IMPLANT
MANIFOLD NEPTUNE II (INSTRUMENTS) ×3 IMPLANT
NEEDLE HYPO 25GX1X1/2 BEV (NEEDLE) ×3 IMPLANT
NS IRRIG 1000ML POUR BTL (IV SOLUTION) ×3 IMPLANT
PACK ORTHO EXTREMITY (CUSTOM PROCEDURE TRAY) ×3 IMPLANT
PAD ARMBOARD 7.5X6 YLW CONV (MISCELLANEOUS) ×6 IMPLANT
PAD CAST 4YDX4 CTTN HI CHSV (CAST SUPPLIES) ×2 IMPLANT
PADDING CAST ABS 4INX4YD NS (CAST SUPPLIES) ×2
PADDING CAST ABS COTTON 4X4 ST (CAST SUPPLIES) ×1 IMPLANT
PADDING CAST COTTON 4X4 STRL (CAST SUPPLIES) ×4
PASSER SUT SWANSON 36MM LOOP (INSTRUMENTS) IMPLANT
SOAP 2 % CHG 4 OZ (WOUND CARE) ×3 IMPLANT
SPLINT FIBERGLASS 3X35 (CAST SUPPLIES) ×3 IMPLANT
SPONGE GAUZE 4X4 12PLY STER LF (GAUZE/BANDAGES/DRESSINGS) ×3 IMPLANT
SPONGE LAP 4X18 X RAY DECT (DISPOSABLE) ×3 IMPLANT
STEM RADIAL ALIGN 10X0MM (Stem) ×3 IMPLANT
STRIP CLOSURE SKIN 1/2X4 (GAUZE/BANDAGES/DRESSINGS) ×2 IMPLANT
SUT PROLENE 3 0 PS 2 (SUTURE) ×6 IMPLANT
SUT PROLENE 4 0 PS 2 18 (SUTURE) ×3 IMPLANT
SUT VIC AB 0 CT1 27 (SUTURE) ×4
SUT VIC AB 0 CT1 27XBRD ANBCTR (SUTURE) ×2 IMPLANT
SUT VIC AB 2-0 FS1 27 (SUTURE) ×3 IMPLANT
SUT VICRYL 4-0 PS2 18IN ABS (SUTURE) ×3 IMPLANT
SYR CONTROL 10ML LL (SYRINGE) ×3 IMPLANT
TOWEL OR 17X24 6PK STRL BLUE (TOWEL DISPOSABLE) ×3 IMPLANT
TOWEL OR 17X26 10 PK STRL BLUE (TOWEL DISPOSABLE) ×6 IMPLANT
TRAY FOLEY CATH 16FRSI W/METER (SET/KITS/TRAYS/PACK) IMPLANT
TUBE CONNECTING 12'X1/4 (SUCTIONS) ×1
TUBE CONNECTING 12X1/4 (SUCTIONS) ×2 IMPLANT
UNDERPAD 30X30 INCONTINENT (UNDERPADS AND DIAPERS) ×3 IMPLANT
WATER STERILE IRR 1000ML POUR (IV SOLUTION) ×3 IMPLANT

## 2014-07-26 NOTE — Progress Notes (Signed)
Patient states he does not take multivitamins or vitamin C tablets anymore but he needs his pancreatic enzyme medication to take with meals, Physician office paged awaiting call back from the PA.

## 2014-07-26 NOTE — Discharge Instructions (Signed)
KEEP BANDAGE CLEAN AND DRY CALL OFFICE FOR F/U APPT [phone] in 14 days DR [facility] CELL PHONE [phone] KEEP HAND ELEVATED ABOVE HEART OK TO APPLY ICE TO OPERATIVE AREA CONTACT OFFICE IF ANY WORSENING PAIN OR CONCERNS.

## 2014-07-26 NOTE — Progress Notes (Signed)
Dr. Shon Millet (Nephologist: [facility] notified of Hgb, Bun and Creatinine

## 2014-07-26 NOTE — Progress Notes (Signed)
Pt states he thinks he has a urinary infection.  Attempted to contact Dr. Caralyn Guile without success.  Dr. Deatra Canter notified and stat urine ordered and sent.

## 2014-07-26 NOTE — Discharge Summary (Signed)
Physician Discharge Summary  Patient ID: [name] MRN: [id number] DOB/AGE: 64/24/1952 64 y.o.  Admit date: 07/26/2014 Discharge date:07/27/2014   Admission Diagnoses: RIGHT ELBOW RADIAL HEAD MALUNION Past Medical History  Diagnosis Date  . Hypertension   . Diabetes mellitus, type II 2010  . Pancreatic adenocarcinoma 2011    Status post resection and chemotherapy; weight loss and malnutrition  . Depression   . Biliary obstruction 2012    Biliary stent replaced in 2012 secondary to reobstruction  . GERD (gastroesophageal reflux disease)   . Anemia   . 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 Cataracts, bilateral   . GI bleed   . Arachnoid cyst   . Chronic kidney disease     CRF  . Shortness of breath     with exertion  . Anxiety   . History of kidney stones   . History of blood transfusion   . PE (pulmonary embolism) 2011    "I think."  'I was on coumadin for a while, not sure how long or what MD."    Discharge Diagnoses:  Active Problems:   Closed fracture of right proximal radius   Surgeries: Procedure(s): RIGHT ELBOW RADIAL HEAD REPLACEMENT JOINT ARTHROTOMY DEBRIDEMENT  on 07/26/2014    Consultants:    Discharged Condition: Improved  Hospital Course: [name] is an 63 y.o. male who was admitted 07/26/2014 with a chief complaint of No chief complaint on file. , and found to have a diagnosis of RIGHT ELBOW RADIAL HEAD MALUNION.  They were brought to the operating room on 07/26/2014 and underwent Procedure(s): RIGHT ELBOW RADIAL HEAD REPLACEMENT JOINT ARTHROTOMY DEBRIDEMENT .    They were given perioperative antibiotics: Anti-infectives   Start     Dose/Rate Route Frequency Ordered Stop   07/27/14 0600  ceFAZolin (ANCEF) IVPB 2 g/50 mL premix     2 g 100 mL/hr over 30 Minutes Intravenous On call to O.R. 07/26/14 1228 07/26/14 1328   07/27/14 0200  ceFAZolin (ANCEF) IVPB 1 g/50 mL premix     1 g 100 mL/hr over 30 Minutes Intravenous 3 times per day 07/26/14 1702     07/26/14 1800  ceFAZolin (ANCEF) IVPB 1 g/50 mL premix     1 g 100 mL/hr over 30 Minutes Intravenous NOW 07/26/14 1702 07/26/14 1819    .  They were given sequential compression devices, early ambulation, and Other (comment)ambulation for DVT prophylaxis.  Recent vital signs: Patient Vitals for the past 24 hrs:  BP Temp Pulse Resp SpO2 Height Weight  07/26/14 1659 145/65 mmHg 97.9 F (36.6 C) 70 14 99 % - -  07/26/14 1630 139/68 mmHg 97.5 F (36.4 C) 71 10 100 % - -  07/26/14 1615 117/71 mmHg - 69 11 92 % - -  07/26/14 1600 133/76 mmHg - 77 17 97 % - -  07/26/14 1545 144/67 mmHg - 77 8 98 % - -  07/26/14 1530 137/90 mmHg - 81 9 96 % - -  07/26/14 1515 - - 85 8 95 % - -  07/26/14 1508 140/75 mmHg 98 F (36.7 C) 93 11 96 % - -  07/26/14 1036 137/77 mmHg - 73 18 100 % 6' (1.829 m) 84.369 kg (186 lb)  .  Recent laboratory studies: No results found.  Discharge Medications:     Medication List         amitriptyline  10 MG tablet  Commonly known as:  ELAVIL  Take 10 mg by mouth 2 (two) times daily.     buPROPion 150 MG 24 hr tablet  Commonly known as:  WELLBUTRIN XL  Take 150 mg by mouth daily.     ferrous sulfate 325 (65 FE) MG EC tablet  Take 325 mg by mouth daily.     gabapentin 100 MG capsule  Commonly known as:  NEURONTIN  Take 100-200 mg by mouth 2 (two) times daily. 1 capsule (100 mg) in the am and 2 capsules (200 mg) in the evening     glipiZIDE 5 MG tablet  Commonly known as:  GLUCOTROL  Take 5 mg by mouth daily.     LORazepam 0.5 MG tablet  Commonly known as:  ATIVAN  Take 0.5 mg by mouth 2 (two) times daily.     magnesium oxide 400 MG tablet  Commonly known as:  MAG-OX  Take 400 mg by mouth daily.     metoprolol tartrate 25 MG tablet  Commonly known as:  LOPRESSOR  Take 25 mg by mouth 2 (two) times daily.      pantoprazole 40 MG tablet  Commonly known as:  PROTONIX  Take 1 tablet (40 mg total) by mouth daily.     pyridOXINE 100 MG tablet  Commonly known as:  VITAMIN B-6  Take 100 mg by mouth daily.     vitamin A 8000 UNIT capsule  Take 8,000 Units by mouth daily.     vitamin C with rose hips 1000 MG tablet  Take 1,000 mg by mouth daily.     vitamin E 400 UNIT capsule  Take 400 Units by mouth daily.     ZENPEP 10000 UNITS Cpep  Generic drug:  Pancrelipase (Lip-Prot-Amyl)  Take 3 capsules by mouth 3 (three) times daily.        Diagnostic Studies: Dg Chest 2 View  07/18/2014   CLINICAL DATA:  Preop for elbow surgery  EXAM: CHEST  2 VIEW  COMPARISON:  Chest x-ray of 04/25/2013  FINDINGS: The lungs are clear but slightly hyperaerated. Mediastinal and hilar contours are unchanged. The heart is within upper limits of normal. There are degenerative changes in the mid to lower thoracic spine.  IMPRESSION: No active cardiopulmonary disease.   Electronically Signed   By: Ivar Drape M.D.   On: 07/18/2014 15:49    They benefited maximally from their hospital stay and there were no complications.     Disposition: 01-Home or Self Care      Follow-up Information   Follow up with Linna Hoff, MD In 2 weeks.   Specialty:  Orthopedic Surgery   Contact information:   [address] [place] [address] [phone]        Signed: Linna Hoff 07/26/2014, 8:10 PM

## 2014-07-26 NOTE — Anesthesia Postprocedure Evaluation (Signed)
  Anesthesia Post-op Note  Patient: [name]  Procedure(s) Performed: Procedure(s): RIGHT ELBOW RADIAL HEAD REPLACEMENT JOINT ARTHROTOMY DEBRIDEMENT  (Right)  Patient Location: PACU  Anesthesia Type:GA combined with regional for post-op pain  Level of Consciousness: awake, alert  and oriented  Airway and Oxygen Therapy: Patient Spontanous Breathing  Post-op Pain: mild  Post-op Assessment: Post-op Vital signs reviewed  Post-op Vital Signs: Reviewed  Last Vitals:  Filed Vitals:   07/26/14 1615  BP: 117/71  Pulse: 69  Temp:   Resp: 11    Complications: No apparent anesthesia complications

## 2014-07-26 NOTE — Transfer of Care (Signed)
Immediate Anesthesia Transfer of Care Note  Patient: [name]  Procedure(s) Performed: Procedure(s): RIGHT ELBOW RADIAL HEAD REPLACEMENT JOINT ARTHROTOMY DEBRIDEMENT  (Right)  Patient Location: PACU  Anesthesia Type:General  Level of Consciousness: awake and alert   Airway & Oxygen Therapy: Patient Spontanous Breathing and Patient connected to nasal cannula oxygen  Post-op Assessment: Report given to PACU RN, Post -op Vital signs reviewed and stable and Patient moving all extremities  Post vital signs: Reviewed and stable  Complications: No apparent anesthesia complications

## 2014-07-26 NOTE — Anesthesia Procedure Notes (Signed)
Anesthesia Regional Block:  Supraclavicular block  Pre-Anesthetic Checklist: ,, timeout performed, Correct Patient, Correct Site, Correct Laterality, Correct Procedure, Correct Position, site marked, Risks and benefits discussed,  Surgical consent,  Pre-op evaluation,  At surgeon's request and post-op pain management  Laterality: Right  Prep: chloraprep       Needles:  Injection technique: Single-shot  Needle Type: Stimiplex     Needle Length: 9cm 9 cm Needle Gauge: 21 and 21 G    Additional Needles:  Procedures: ultrasound guided (picture in chart) and nerve stimulator Supraclavicular block  Nerve Stimulator or Paresthesia:  Response: Radial response, 0.48 mA,   Additional Responses:   Narrative:  Start time: 07/26/2014 12:55 PM End time: 07/26/2014 1:05 PM Injection made incrementally with aspirations every 5 mL.  Performed by: Personally  Anesthesiologist: Suzette Battiest, MD  Additional Notes: Risks, benefits and alternative to block explained extensively.  Patient tolerated procedure well, without complications.

## 2014-07-26 NOTE — Anesthesia Preprocedure Evaluation (Addendum)
Anesthesia Evaluation  Patient identified by MRN, date of birth, ID band Patient awake    Reviewed: Allergy & Precautions, H&P , NPO status , Patient's Chart, lab work & pertinent test results  Airway Mallampati: III TM Distance: >3 FB Neck ROM: Full    Dental  (+) Edentulous Upper, Edentulous Lower   Pulmonary neg pulmonary ROS,  breath sounds clear to auscultation        Cardiovascular hypertension, Rhythm:Regular Rate:Normal     Neuro/Psych PSYCHIATRIC DISORDERS Anxiety Depression negative neurological ROS     GI/Hepatic GERD-  ,  Endo/Other  diabetes  Renal/GU CRFRenal disease     Musculoskeletal negative musculoskeletal ROS (+)   Abdominal   Peds  Hematology  (+) anemia ,   Anesthesia Other Findings   Reproductive/Obstetrics negative OB ROS                          Anesthesia Physical Anesthesia Plan  ASA: III  Anesthesia Plan: General   Post-op Pain Management:    Induction: Intravenous  Airway Management Planned: Oral ETT  Additional Equipment: None  Intra-op Plan:   Post-operative Plan: Extubation in OR  Informed Consent: I have reviewed the patients History and Physical, chart, labs and discussed the procedure including the risks, benefits and alternatives for the proposed anesthesia with the patient or authorized representative who has indicated his/her understanding and acceptance.     Plan Discussed with: CRNA, Anesthesiologist and Surgeon  Anesthesia Plan Comments:         Anesthesia Quick Evaluation

## 2014-07-27 DIAGNOSIS — IMO0002 Reserved for concepts with insufficient information to code with codable children: Secondary | ICD-10-CM | POA: Diagnosis not present

## 2014-07-27 LAB — GLUCOSE, CAPILLARY
GLUCOSE-CAPILLARY: 173 mg/dL — AB (ref 70–99)
Glucose-Capillary: 119 mg/dL — ABNORMAL HIGH (ref 70–99)

## 2014-07-27 NOTE — Op Note (Signed)
NAMEBARUCH, [name] NO.:  [id number]  MEDICAL RECORD NO.:  [id number]  LOCATION:  5N27C                        FACILITY:  [facility]  PHYSICIAN:  Melrose Nakayama, MD  DATE OF BIRTH:  09/27/1951  DATE OF PROCEDURE:  07/26/2014 DATE OF DISCHARGE:                              OPERATIVE REPORT   PREOPERATIVE DIAGNOSIS:  Right elbow proximal radius malunion.  POSTOPERATIVE DIAGNOSIS:  Right elbow proximal radius malunion.  ATTENDING PHYSICIAN:  Linna Hoff IV, MD, who scrubbed and present for the entire procedure.  ASSISTANT SURGEON:  None.  ANESTHESIA: 1. General via LMA. 2. Supraclavicular block performed by the Anesthesia.  PROCEDURE: 1. Right elbow radial head arthroplasty for repair of radial head     malunion. 2. Right elbow joint arthrotomy, exploration, and removal of loose     bodies. 3. Right elbow radiograph, 3 views.  SURGICAL IMPLANTS:  Align skeletal dynamics radial head, 22 mm head, 0 neck, 10 mm stem.  SURGICAL INDICATIONS:  Mr. [name] is a right-hand-dominant gentleman, who sustained a proximal radius fracture.  The patient has had persistent pain.  He was known to have malunion of the proximal radius. Patient elected to undergo the above procedure.  Risks, benefits, and alternatives were discussed in detail with the patient, and signed informed consent was obtained.  Risks include but not limited to bleeding, infection, damage to nearby nerves, arteries, or tendons, loss of motion of wrist and digits, incomplete relief of symptoms, nonunion, malunion, hardware failure, and need for further surgical intervention.  DESCRIPTION OF PROCEDURE:  The patient was properly identified in the preoperative holding area and marked with a permanent marker made on the right elbow to indicate correct operative site.  The patient was then brought back to the operating room, placed supine on the anesthesia table.  General anesthesia was  administered.  The patient tolerated this well.  A well-padded tourniquet was placed on the right brachium sealed with 1000-drape.  The right upper extremity was then prepped and draped in normal sterile fashion.  Time-out was called.  Correct site was identified, and procedure then begun.  Attention turned to the right elbow.  A curvilinear incision was made directly over the lateral aspect of the elbow.  The limb was then elevated, and tourniquet insufflated. Preoperative antibiotics were given prior to skin incision.  Dissection was then carried down through the skin and subcutaneous tissue.  The fascial layer was incised and opening up the joint.  Upon gross inspection of the joint, the patient did have grade 4 chondral changes on the capitellum as well as advanced grade 4 changes on the radial head, and the patient did have malunion of the radial head with several small loose fragments within the elbow joint.  Thorough wound irrigation was done throughout and loose body removal after the arthrotomy was then carried out to remove the loose fragments.  Once this was carried out, small retractors were then placed around the radial neck.  Hohmann retractors were brought protecting down the nerves anteriorly.  Once this was carried out using small oscillating saw, the radial head cut was then made.  This was then modified cutting enough distally  for preparation of the implant.  Once this was carried out, the radial shaft was then delivered into the wound with the appropriate rectractor and the broaching was then begun from 7 mm extending up to 10 mm.  Once this was carried out, a 10 mm planer was then used to plane down the proximal radius.  After this was carried out, the trial stem was then placed. After trial stem, trial implants were then placed and the elbow was placed through full range of motion.  The head had been cut out and appropriately sized.  After the appropriate head was  then chosen, the trial implants were then removed.  Copious wound irrigation done throughout.  The appropriate implants were then placed and the stem was then placed into the canal.  This was then tamped down with __________ with good fill and seating of the implant.  Following this, the head was then applied.  Using the radial head alignment guide with the small mark in the ulnar fovea, the screw was then seated into the radial head creating an approximately 20 to 30 degrees of pronation and anatomical alignment of the forearm.  The wound was then thoroughly irrigated. After thorough wound irrigation, the capsule was then closed with 0 Vicryl suture.  The extensor interval was closed with 2-0 Vicryl suture. Subcutaneous tissues closed with 4-0 Vicryl, and skin closed with 3-0 Prolene horizontal mattress sutures.  Adaptic dressing, sterile compressive bandage then applied.  The patient was then placed in a well- padded long-arm splint, extubated, and taken to recovery room in good condition.  Radiographs were then used intraoperatively to confirm the placement __________ trial implantation as well as final images.  Radiographic interpretation:  AP and lateral radial head views did show the arthroplasty in place.  There was good joint alignment in all planes.  POSTPROCEDURE PLAN:  The patient was admitted overnight for IV antibiotics and pain control, discharge in the morning, seen back in the office in approximately 2 weeks for wound check, suture removal, x-rays, and then begin active range of motion and use of the elbow.  Prognosis:  The patient did have the significant grade 4 changes on the capitellum and the radial head.  The patient has the arthroplasty in place and hopefully this will relieve some of the dull aching pain that he has in the outside part of the elbow, and it will be difficult to predict __________ metal surface does against the bone on the capitellum, but I  felt that replacement is a better alternative than __________ resection arthroplasty, but if he continues to have pain, then one may consider the resection of the radial and removal of the implant and leaving in with the radial head resection and based on the advanced arthrosis in the capitellum and the proximal radius. Radiographs at each visit.     Melrose Nakayama, MD     FWO/MEDQ  D:  07/26/2014  T:  07/26/2014  Job:  [id number]

## 2014-07-27 NOTE — Progress Notes (Signed)
Orthopedic Tech Progress Note Patient Details:  [name] May 01, 1951 [id number]  Ortho Devices Type of Ortho Device: Arm sling Ortho Device/Splint Location: RUE Ortho Device/Splint Interventions: Ordered;Application   Braulio Bosch 07/27/2014, 4:12 PM

## 2014-07-27 NOTE — Evaluation (Signed)
Physical Therapy Evaluation Patient Details Name: [name] MRN: [id number] DOB: 1950/12/08 Today's Date: 07/27/2014   History of Present Illness  Right elbow radial head arthroplasty for repair of radial head  Clinical Impression  Patient is s/p R radial head arthroplasty surgery resulting in functional limitations due to the deficits listed below (see PT Problem List).  No skilled PT intervention indicated.  Pt is at min guard assist level for all mobility with gait distance of 300 ft without A.D. Pt will have needed level of assist at home with family. PT signing off.    Follow Up Recommendations No PT follow up    Equipment Recommendations  None recommended by PT    Recommendations for Other Services       Precautions / Restrictions Precautions Required Braces or Orthoses: Sling Restrictions Other Position/Activity Restrictions: R elbow in cast      Mobility  Bed Mobility Overal bed mobility: Modified Independent             General bed mobility comments: HOB elevated and use of rails  Transfers Overall transfer level: Needs assistance Equipment used: None Transfers: Stand Pivot Transfers Sit to Stand: Min guard Stand pivot transfers: Min guard       General transfer comment: wife will A as able, but she does care for her mother so pt will be alone at times  Ambulation/Gait Ambulation/Gait assistance: Min guard Ambulation Distance (Feet): 300 Feet Assistive device: None Gait Pattern/deviations: Decreased stride length;Narrow base of support;Step-through pattern   Gait velocity interpretation: Below normal speed for age/gender    Stairs            Wheelchair Mobility    Modified Rankin (Stroke Patients Only)       Balance Overall balance assessment: Needs assistance Sitting-balance support: No upper extremity supported;Feet unsupported Sitting balance-Leahy Scale: Normal     Standing balance support: No upper extremity  supported Standing balance-Leahy Scale: Good                               Pertinent Vitals/Pain Pain Assessment: 0-10 Pain Score: 10-Worst pain ever Pain Location: RUE Pain Intervention(s): Monitored during session;Patient requesting pain meds-RN notified    Home Living Family/patient expects to be discharged to:: Private residence Living Arrangements: Spouse/significant other;Children Available Help at Discharge: Family;Available PRN/intermittently Type of Home: Mobile home Home Access: Ramped entrance     Home Layout: One level Home Equipment: Cane - single point;Walker - 2 wheels      Prior Function Level of Independence: Independent               Hand Dominance   Dominant Hand: Right    Extremity/Trunk Assessment   Upper Extremity Assessment: RUE deficits/detail RUE Deficits / Details: R elbow surgery.  Pt able to move R fingers as well as R elbow but pain signiifcant so pt not able to perform full ROM                   Communication   Communication: No difficulties  Cognition Arousal/Alertness: Awake/alert Behavior During Therapy: WFL for tasks assessed/performed Overall Cognitive Status: Within Functional Limits for tasks assessed                      General Comments      Exercises        Assessment/Plan    PT Assessment Patent does not need any further PT  services  PT Diagnosis     PT Problem List    PT Treatment Interventions     PT Goals (Current goals can be found in the Care Plan section) Acute Rehab PT Goals Patient Stated Goal: get this arm better! how long will it take? PT Goal Formulation: No goals set, d/c therapy    Frequency     Barriers to discharge        Co-evaluation               End of Session Equipment Utilized During Treatment: Gait belt Activity Tolerance: Patient tolerated treatment well Patient left: in chair;with call bell/phone within reach Nurse Communication: Mobility  status    Functional Assessment Tool Used: clinical judgement Functional Limitation: Mobility: Walking and moving around Mobility: Walking and Moving Around Current Status ([id number]): At least 1 percent but less than 20 percent impaired, limited or restricted Mobility: Walking and Moving Around Goal Status [phone]): At least 1 percent but less than 20 percent impaired, limited or restricted Mobility: Walking and Moving Around Discharge Status [phone]): At least 1 percent but less than 20 percent impaired, limited or restricted    Time: 1121-1138 PT Time Calculation (min): 17 min   Charges:   PT Evaluation $Initial PT Evaluation Tier I: 1 Procedure PT Treatments $Gait Training: 8-22 mins   PT G Codes:   Functional Assessment Tool Used: clinical judgement Functional Limitation: Mobility: Walking and moving around    Lorriane Shire 07/27/2014, 12:21 PM

## 2014-07-27 NOTE — Evaluation (Signed)
Occupational Therapy Evaluation Patient Details Name: [name] MRN: [id number] DOB: [birth year] Today's Date: 07/27/2014    History of Present Illness Right elbow radial head arthroplasty for repair of radial head   Clinical Impression   Pt presents to OT s/p elbow surgery. Pt overall min A with ADL activity . Pt limited by pain.  RN aware. Education complete regarding ADL activity , sling management and finger ROM.  Pt needs max encouragement    Follow Up Recommendations  Outpatient OT;Other (comment) (as MD indicates)    Equipment Recommendations  None recommended by OT       Precautions / Restrictions Precautions Required Braces or Orthoses: Sling Restrictions Weight Bearing Restrictions: No Other Position/Activity Restrictions: R elbow in cast      Mobility Bed Mobility Overal bed mobility: Needs Assistance             General bed mobility comments: pt in chair  Transfers Overall transfer level: Needs assistance   Transfers: Sit to/from Stand Sit to Stand: Min guard         General transfer comment: wife will A as able, but she does care for her mother so pt will be alone at times         ADL Overall ADL's : Needs assistance/impaired                         Toilet Transfer: Economist and Hygiene: Min guard;Sit to/from stand         General ADL Comments: Pt overall min A with ADL activity. Educated on technique for donning shirt and sling.  Pt able to verbalize understanding and states wife will A               Pertinent Vitals/Pain Pain Assessment: 0-10 Pain Score: 10-Worst pain ever Pain Intervention(s): Monitored during session;Patient requesting pain meds-RN notified     Hand Dominance  right   Extremity/Trunk Assessment Upper Extremity Assessment Upper Extremity Assessment: RUE deficits/detail RUE Deficits / Details: R elbow surgery.  Pt able to move R fingers as  well as R elbow but pain signiifcant so pt not able to perform full ROM              Cognition Arousal/Alertness: Awake/alert Behavior During Therapy: WFL for tasks assessed/performed Overall Cognitive Status: Within Functional Limits for tasks assessed                     General Comments    educated pt regarding planning for times wife would not be home- such as wife making pt a sandwich and leaving it for him , etc          OT Diagnosis: Generalized weakness;Acute pain   OT Problem List: Decreased range of motion;Pain   OT Treatment/Interventions: Self-care/ADL training;Patient/family education;Therapeutic exercise    OT Goals(Current goals can be found in the care plan section) Acute Rehab OT Goals Patient Stated Goal: get this arm better! how long will it take?  OT Frequency:                End of Session Nurse Communication: Mobility status  Activity Tolerance: Patient limited by pain (RN notified) Patient left: in chair   Time: [phone] OT Time Calculation (min): 19 min Charges:  OT General Charges $OT Visit: 1 Procedure OT Evaluation $Initial OT Evaluation Tier I: 1 Procedure OT Treatments $Self Care/Home Management : 8-22 mins G-Codes: OT  G-codes **NOT FOR INPATIENT CLASS** Functional Assessment Tool Used: clinical observation Functional Limitation: Self care Self Care Current Status (K1224): At least 20 percent but less than 40 percent impaired, limited or restricted Self Care Goal Status (S9753): At least 1 percent but less than 20 percent impaired, limited or restricted Self Care Discharge Status [phone]): At least 20 percent but less than 40 percent impaired, limited or restricted  [place], Thereasa Parkin 07/27/2014, 12:05 PM

## 2014-07-27 NOTE — Progress Notes (Signed)
Patient assisted to bathroom and voided 200cc. He does not c/o still feeling like he needs to empty his bladder further. Will continue to monitor.

## 2014-07-27 NOTE — Progress Notes (Signed)
Patient voiding inadequate amount. First bladder scan post void showed 27ml in bladder after he voided out 114ml of clear yellow urine. Second attempt at voiding he put out 152ml of urine. Post void residual from bladder scan showed >999. In and out cath done. Drained out 1000 ml of clear yellow urine. Will continue to monitor urine output.

## 2014-08-03 ENCOUNTER — Encounter (HOSPITAL_COMMUNITY): Payer: Self-pay | Admitting: Orthopedic Surgery

## 2015-03-06 NOTE — Consult Note (Signed)
Chief Complaint:   Subjective/Chief Complaint Please seee full Gi consult.  Patietn seen and examined. Patietn with h/o pancreatic CA treated with surgery, radiation and chemotherapy at [facility] center in 02/2012.  He is due to see his surgeon, Dr Birdie Sons,  in January with prearranged appointment.  Patietn eating only one meal daily at home due to social situation.  Patietn states he does have an appetite.  There has been some weight loss.  He likes to eat things like cheeseburgers and french fries.  He has been prescribed pancreatic enzymes and is on these in the hospital.  Recommend continuing current, medications reviewed.  Agree with current.  Following.   VITAL SIGNS/ANCILLARY NOTES: **Vital Signs.:   22-Nov-13 07:48   Vital Signs Type Routine   Temperature Temperature (F) 98.8   Celsius 37.1   Pulse Pulse 86   Respirations Respirations 20   Systolic BP Systolic BP 244   Diastolic BP (mmHg) Diastolic BP (mmHg) 71   Pulse Pulse Sitting 86  *Intake and Output.:   22-Nov-13 08:13   Percentage of Meal Eaten  100    13:27   Percentage of Meal Eaten  80    19:06   Percentage of Meal Eaten  100   Electronic Signatures: Loistine Simas (MD)  (Signed [id number] 19:21)  Authored: Chief Complaint, VITAL SIGNS/ANCILLARY NOTES   Last Updated: 22-Nov-13 19:21 by Loistine Simas (MD)

## 2015-03-06 NOTE — H&P (Signed)
PATIENT NAME:  [name], [name] MR#:  [id number] DATE OF BIRTH:  03-22-1951  DATE OF ADMISSION:  08/28/2012  INITIAL ASSESSMENT AND PSYCHIATRIC EVALUATION  IDENTIFYING INFORMATION: Patient is a 65 year old white male not employed and draws disability for pancreatic cancer since April 2012. Patient is married for 25 years and lived together for 78 years with the same woman. Patient and his wife and 57-1/2 year old daughter live together in a three bedroom house. Patient comes for his first inpatient hospitalization in psychiatry at [facility] with a chief complaint "My wife thought that I should come here for help. She thinks that I am depressed and I need some help and she was the person who brought me here. In fact she called the police and they brought me here and she signed the papers for me to come here."  HISTORY OF PRESENT ILLNESS: When patient was asked when he last felt well he reported he was feeling well until he was diagnosed with pancreatic cancer. He was not able to function, not able to work and wife did not want him to hang around with her all or they 49-1/2 year old daughter. In fact he started feeling lonely and left out and this has caused him to be depressed. According to information obtained from the chart patient was brought on IVC that states that he had been suffering with severe depression and anxiety since he was diagnosed with pancreatic cancer. He has been taking several medications and he stated to his wife that he wants to hurt himself, stated to his family that life was not worth living. However, patient denies these statements and he said he never stated that but he had been feeling lonely because of isolation by his wife and daughter who do not want him to hang around him all the time.   PAST PSYCHIATRIC HISTORY: No previous history of inpatient hospitalization in psychiatry. No history of suicide and not being followed by psychiatrist at this time.   FAMILY  HISTORY OF MENTAL ILLNESS: No known mental illness. No history of suicides in the family.   FAMILY HISTORY: Raised by parents. Father farmed. Father died of MI at age 29 years. Mother stayed home. Mother died of stomach problems at age 83 years. Has one brother. They talk to each other but not very close to brother.  PERSONAL HISTORY: Born in [place]. Graduated from high school. No college.  WORK HISTORY: First job was working in [employer] at age 38 years. This job lasted for two years. 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 This lasted 30 years until he had to close it down in March 2013 because of not being able to work since he was having pancreatic cancer and problems with the same.   MILITARY HISTORY: None.  MARRIAGES: Married once. Married to the same woman for 25 years and lived together for 30 years. Patient does not want to see his house and get separated from his wife because his daughter who is 65-1/2 years old has no place to go and so he is hanging around his marriage though it is not working out very well at this time.  ALCOHOL AND DRUGS: First drink of alcohol never. Patient stated "I don't drink". Denies street or prescription drug abuse. Denies using IV drugs. Denies smoking nicotine cigarettes.  PAST MEDICAL HISTORY: No known history of high blood pressure. Was diabetic when he was weighing 200 pounds and ever since  he had cancer of pancreas he lost a lot of weight and his diabetes has been stable. Status post surgery for pancreatic cancer in [place] at [facility]. No history of motor vehicle accident, never been unconscious. No known drug allergies. He is on several medications which are as follows: Paxil 10 mg daily for his depression secondary to pancreatic cancer, Klonopin for anxiety, ondansetron 8 mg q.8 hours as needed, pantoprazole, Protonix 40 mg before breakfast, promethazine 25 mg q.6 hours as  needed, quinapril 20 mg  Ambien 10 mg at bedtime, amitriptyline 10 mg twice daily, Xanax 0.25 mg p.o. t.i.d.,  bisacodyl 5 mg daily, clonazepam 1 mg twice a day, Cyanocobalamine 1000 mg daily, magnesium citrate 300 mL daily, megestrol acetate 40 mg daily, metformin 500 mg twice a day, metoclopramide 10 mg q.i.d., metoprolol 12.5 mg b.i.d. Evidently he does have borderline diabetes and hypertension though he said he is stable. Being followed by Dr. Meriel Flavors at [facility], last appointment a couple of months ago, next appointment 11/2012.   PHYSICAL EXAMINATION: VITAL SIGNS: Temperature 98.1, pulse 88 per minute regular, respirations 20 per minute regular, blood pressure 150/80 mmHg, pulse 60 per minute.  HEENT: Head is normocephalic, atraumatic. Pupils are equal, round, and reactive to light and accommodation. Fundi bilaterally benign. Extraocular movements visualized. Tympanic membrane visualized, no exudates.   NECK: Supple without any organomegaly, lymphadenopathy, thyromegaly.  CHEST: Normal expansion. No breath sounds.  HEART: Normal S1, S2 without any murmurs or gallops.  ABDOMEN: Soft. No organomegaly. Bowel sounds heard.  RECTAL: Deferred.  NEUROLOGICAL: Gait is normal. Romberg is negative. Cranial nerves II through XII intact. DTRs 2+ and normal. Plantars normal response.   SKIN: Normal turgor. Scar from surgery on the abdomen has healed well.   MENTAL STATUS EXAMINATION: Patient is dressed in street clothes, alert and oriented to place, person, fully aware of situation brought him for admission to [facility]. Cognition is intact. He knew day, date, time, place, person, season, situation. He knew current president and previous president. He knew capital of [place], capital of [place]. Affect is appropriate with his mood which is low and down. Admits feeling lonely and down since his wife told him not to hang around her and their daughter. Feels hopeless and  helpless at times. Denies any suicidal/homicidal ideas or plans and reports that he has never stated that he was suicidal. No evidence of psychosis. Denies auditory or visual hallucinations. Denies hearing voices, seeing things. Denies paranoid or suspicious ideas. Denies thought control. Denies having any grandiose ideas. Memory is intact. He could count money. He could spell the word world forward and backward without any problems. Abstract interpretation is good. Does admit to sleep disturbance. Appetite is fair and good. Insight and judgement guarded.  IMPRESSION: AXIS I: Major depression, single episode since diagnosis of pancreatic cancer and problems related to the same.   AXIS II: Deferred.   AXIS III:  1. Pancreatic cancer and surgery for the same.  2. Hypertension. 3. Diabetes which is under control.  4. Gastroesophageal reflux disease.  AXIS IV: Severe. Diagnosed with pancreatic cancer and since then he has been having problems with occupation and financial and marital conflicts related to the same.  AXIS V: Global Assessment of Functioning 25.  PLAN: Patient admitted to [facility] for close observation, evaluation and help. He will be started back on all of his above-named medications which will be adjusted to control his mood so that he will be  stable enough. Ambien will be discontinued as he reports that this does not help him rest well at night. He will be started on trazodone at low dose which will be increased and hopefully this will help him rest better. During the stay in the hospital he will be given milieu therapy and supportive counseling where coping skills in dealing with stressors of life will be discussed and marital problems will be discussed and family counseling will be done where patient and his wife will have better understanding of each other. At the time of discharge patient will not be feeling depressed and will have enough coping skills in dealing with  his loneliness and appropriate follow-up appointments will be made along with counseling.   ____________________________ Wallace Cullens. Franchot Mimes, MD skc:cms D: 08/29/2012 18:02:12 ET T: 08/30/2012 06:15:50 ET JOB#: [id number]  cc: Arlyn Leak K. Franchot Mimes, MD, <Dictator> Dewain Penning MD ELECTRONICALLY SIGNED 08/30/2012 14:27

## 2015-03-06 NOTE — H&P (Signed)
PATIENT NAME:  [name], SANKER MR#:  [id number] DATE OF BIRTH:  [birth year]  DATE OF ADMISSION:  10/05/2012  REFERRING PHYSICIAN: Lenise Arena, MD  ATTENDING PHYSICIAN: Orson Slick, MD  IDENTIFYING DATA: Mr. [name] is a 64 year old male with history of depression and pancreatic cancer.   CHIEF COMPLAINT: "I want to die."  HISTORY OF PRESENT ILLNESS: [name] was hospitalized at [facility] in October of 2013 for worsening of depression and anxiety. He was discharged on a combination of clonazepam, trazodone, and Zoloft. He has been following up with Dr. Sherlynn Stalls at [facility]. The patient reports good treatment compliance, but complains that his home situation has changed traumatically since his last hospitalization. He is separated from his wife and even though the wife lives still under the same roof along with her 49 year old daughter, the patient does not communicate with any of them. They do not help him with cooking or shopping. He is unable to drive a car and has never been able to prepare a meal for himself. His wife apparently does not cook at all and eats out. The patient is stuck at the house. He is bored, lonely, and has been losing weight due to lack of food. He has not been able to sleep. He worries all the time. He misses his family. He reports crying spells, feeling of guilt, hopelessness, worthlessness, social isolation, anhedonia, and poor energy and concentration that culminated in suicidal thoughts, but the patient did not develop a specific plan as of yet. The patient has been excessively worried and anxious about his medical health. He had cancer tumor resected and he also had Whipple procedure. He apparently is in good standing and his next appointment with his cancer doctor is in January of 2014. He denies alcohol, illicit substance, or prescription pill abuse.   PAST PSYCHIATRIC HISTORY: None except for one prior hospitalization here and never  treated for depression and anxiety. No substance abuse treatment. No suicide attempts.   FAMILY PSYCHIATRIC HISTORY: None reported.    PAST MEDICAL HISTORY:  1. History of pancreatic cancer. 2. Status post Whipple procedure. 3. Constipation.  4. Diabetes.  5. Hypertension.   ALLERGIES: No known drug allergies.   MEDICATIONS ON ADMISSION:  1. MiraLax 17 grams daily.  2. Milk of Magnesia 30 mg daily as needed for constipation. 3. Zofran 8 mg every eight hours as needed.  4. Metamucil one packet daily. 5. Bisacodyl 5 mg daily.  6. Clonazepam 1 mg twice daily.  7. Creon 12,000 units two caps two times daily before meals.  8. Vitamin B12 1000 mcg daily.  9. Magnesium citrate 300 mL orally as needed for constipation. 10. Megace 40 mg twice daily.  11. Metformin 500 mg twice daily.  12. Metoprolol 25 mg twice daily.  13. Trazodone 25 mg at bedtime. 14. Quinapril 10 mg daily.  15. Protonix 40 mg daily.  16. Metoclopramide 10 mg every six hours as needed.  17. Zoloft 50 mg daily.   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 He is unable to work anymore. His wife left him but they still live under the same roof. The patient does not believe that he will be able to take care of himself at the house. He is asking for placement. There is a 30 year old daughter who reportedly pays the patient little attention and has not been supportive. He appears to be uninsured  at the moment. He is disabled but does not qualify for Medicare as of yet.  REVIEW OF SYSTEMS: CONSTITUTIONAL: No fevers or chills. Positive for fatigue and gradual weight loss, 25 pounds in two weeks. EYES: No double or blurred vision. ENT: No hearing loss. RESPIRATORY: No shortness of breath or cough. CARDIOVASCULAR: No chest pain or orthopnea. GASTROINTESTINAL: Positive for nausea, occasional vomiting, and constipation. GU: No  incontinence or frequency. ENDOCRINE: No heat or cold intolerance. LYMPHATIC: No anemia or easy bruising. INTEGUMENTARY: No acne or rash. MUSCULOSKELETAL: No muscle or joint pain. NEUROLOGIC: No tingling or weakness. PSYCHIATRIC: See history of present illness for details.   PHYSICAL EXAMINATION:   VITAL SIGNS: Blood pressure 119/66, pulse 74, respirations 18, and temperature 96.7.   GENERAL: This is a slender, cachetic-appearing male in no acute distress.   HEENT: The pupils are equal, round, and reactive to light. Sclerae anicteric.   NECK: Supple. No thyromegaly.   PULMONARY: Lungs are clear to auscultation. No dullness to percussion.   CARDIOVASCULAR: Heart regular rhythm and rate. No murmurs, rubs, or gallops.   ABDOMEN: Soft, nontender, and nondistended. Positive bowel sounds.   MUSCULOSKELETAL: Normal muscle strength in all extremities.   SKIN: No rashes or bruises.   LYMPHATIC: No cervical adenopathy.   NEUROLOGIC: Cranial nerves II through XII are intact.   LABORATORY DATA: Chemistries: Blood glucose 115, BUN 27, creatinine 1.75, sodium 139, and potassium 3.7 Blood alcohol level is zero. LFTs within normal limits. TSH 1.51. Urine tox screen negative for substances. CBC: White blood count 3.8, hemoglobin 9.8, hematocrit 28, platelets 191, and MCV 92.   Urinalysis is suggestive of urinary tract infection with 3+ leukocyte esterase and over 110 white blood cells per field.   MENTAL STATUS EXAMINATION ON ADMISSION: The patient is alert and oriented to person, place, time, and situation. He is pleasant, polite, and cooperative. There is severe psychomotor retardation. He is in bed wearing private clothes. Grooming is adequate. Eye contact is good. Speech is soft, poverty of speech. Mood is depressed with flat affect. Thought processing is logical but slow. Thought content - he endorses suicidal ideation and hopelessness. He has no reason to go on. There are no thoughts of hurting  others. He is not delusional or paranoid. He is not hallucinating. His cognition is impaired. He registers two out of three and recalls two out of three after 5 minutes. He refuses to spell. He is not interested in politics and does not know the president. His insight and judgment are fair.  SUICIDE RISK ASSESSMENT ON ADMISSION: This is a patient with new onset depression following diagnosis of pancreatic cancer. The patient has multiple social stressors including dissolution of his marriage and lack of primary support. He is at increased risk of suicide.   DIAGNOSES:   AXIS I:  1. 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 2. Anxiety disorder, not otherwise specified.   AXIS II: Deferred.   AXIS III: 1. Pancreatic cancer status post Whipple procedure. 2. Diabetes.  3. Hypertension.  4. Constipation.   AXIS IV: Mental and physical illness, family conflict, access to care, and primary support.   AXIS V: GAF on admission 25.   PLAN: The patient was admitted to [place] unit for safety, stabilization, and medication management. He was initially placed on suicide precautions and was closely monitored for any unsafe behavior. He underwent full psychiatric and risk assessment. He received pharmacotherapy, individual and group psychotherapy, substance abuse counseling, and support from therapeutic  milieu. 1. Suicidal ideation: The patient is able to contract for safety.  2. Mood: We will continue Zoloft for now. We will start Ambien for sleep.  3. Medical: We will continue all his medications as prescribed in the community.  4. Constipation: Apparently a big problem. We will do our best to move his bowels.  5. Social: The patient wants to be placed. It is unclear what financial resources there are. He has no Medicare. We will obtain PASSAR and negotiate with family care homes. ____________________________ Wardell Honour Bary Leriche,  MD jbp:slb D: 10/05/2012 18:01:50 ET T: 10/06/2012 08:14:03 ET JOB#: [id number]  cc: Chloey Ricard B. Bary Leriche, MD, <Dictator> Clovis Fredrickson MD ELECTRONICALLY SIGNED 10/07/2012 8:12

## 2015-03-06 NOTE — Consult Note (Signed)
PATIENT NAME:  [name], [name] MR#:  [id number] DATE OF BIRTH:  13-Oct-1951  DATE OF CONSULTATION:  10/08/2012  REFERRING PHYSICIAN:  Orson Slick, MD CONSULTING PHYSICIAN:  Janalyn Harder. Lewis Shock, MD   REASON FOR CONSULTATION: History of pancreatic cancer and poor appetite.   HISTORY OF PRESENT ILLNESS: This 64 year old patient was admitted for depression and does have a history of pancreatic cancer diagnosed in 2012 with Whipple procedure followed by Dr. Meriel Flavors, [place]:     1. Pancreatic cancer.  2. History of severe anxiety and depression.  3. Hypertension.  4. Diabetes mellitus.  5. Gastroesophageal reflux disease.  6. Constipation.   PAST SURGICAL HISTORY: Whipple procedure.   MEDICATIONS ON ARRIVAL:  1. MiraLAX 17 grams daily.  2. Milk of magnesia as needed for constipation.  3. Zofran 8 mg every eight hours as needed.  4. Metamucil one packet daily.  5. Bisacodyl 5 mg daily.  6. Clonazepam 1 mg twice daily.  7. Creon 12,000 units. He takes 2 tablets with meals. Says he has been eating one meal daily.  8. B12 1000 mcg daily.  9. Magnesium citrate 300 mL orally as need for constipation.  10. Megace 40 mg twice daily.  11. Metformin 500 mg twice daily.  12. Metoprolol 25 mg twice daily. 13. Trazodone 25 mg at bedtime.  14. Quinapril 10 mg daily.  15. Protonix 40 mg daily.  16. Metoclopramide 10 mg as needed. 17. Zoloft 50 mg daily.   ALLERGIES: No known drug allergies.   HABITS: Denies tobacco or drug use.   FAMILY HISTORY: Father deceased with myocardial infarction, 58, mother deceased with stomach problems at age 62.   REVIEW OF SYSTEMS: Ten systems reviewed. Positive for fatigue, weight loss. The patient admits that he does not know how to cook and his wife won't cook for him. He states he will eat a sausage biscuit one time a day and that is the only food he will have all day long. He says  he is hungry, and is enjoying the food here at the hospital. He is reiterating that he needs to be placed in a group home where he can have some help. Says he cannot take care of himself at home and his wife is divorcing him and leaving the household. He does have depression and anxiety. The patient states his abdomen feels fine. He has no abdominal pain. Denies nausea, vomiting. He states he is having one good bowel movement today on his laxative regimen and has noted no problems with diarrhea, severe constipation, or blood or melena. The patient reports upper endoscopy and colonoscopies done several times at [facility]. I do not have records. He is due to see his pancreatic surgeon in January. He states his cancer tumor markers have been followed regularly and he is due for another CT scan. The remaining 10 systems are negative.   PHYSICAL EXAMINATION:  VITAL SIGNS: Temperature 98.8, pulse 86, respiratory rate 20, blood pressure 136/71, sats 96%.   GENERAL: Tall, cachectic Caucasian male, depressed affect, resting in bed in the dark. He is disheveled.   HEENT: Head is normocephalic. Oral mucosa is dry and intact. Trachea is midline. No JVD noted.   CARDIAC: S1 and S2 without murmur, rub or gallop.   LUNGS: Clear to auscultation. Respirations are nonlabored.   ABDOMEN: Soft, Whipple scar noted well healed, no hepatosplenomegaly or palpable masses. No tenderness anywhere in the abdomen.   RECTAL: Deferred.  EXTREMITIES: Lower extremities without edema, cyanosis, or clubbing.   SKIN: Warm and dry.   PSYCHIATRIC: Affect is depressed and he expresses frustration and hopelessness.   LABORATORY, DIAGNOSTIC, AND RADIOLOGICAL DATA: Admission blood work notable for glucose 133, BUN 27, creatinine 1.75 and he has had renal insufficiency on previous admission. He does have normal liver panel including an albumin which is 4.2. TSH 1.51. Drug screen negative. WBC 3.8, hemoglobin 9.8, platelet count 191,  normocytic indices. This appears chronic anemia. Urinalysis positive for WBCs and esterase as well as blood. Urine culture is pending.   IMPRESSION: The patient admitted with severe depression, and has a history of pancreatic cancer and Whipple procedure in 2012 followed at [facility]. He has had some mild complaints of abdominal discomfort, weight loss, decreased appetite. Gastroenterology was consulted. His abdominal pain has resolved. There is no tenderness on exam. He reports he is not eating because he does not know how to cook and he has no access to food. Nursing staff reports he is eating well as an inpatient. There are plans for group home placement on discharge to help with the social situation.  Possible urinary tract infection per urinalysis, culture pending, untreated.  Normocytic anemia. Renal insufficiency noted on labs.   PLAN:  1. Recommend a low fat diet,  2. Continue with same dose Creon but give in the middle of each meal.  3. Nutrition consult has been requested. The patient says he is eating well. Will need to be monitored for weight.  4. Would treat urinary tract infection.  5. Consider outpatient evaluation if needed for chronic anemia, but since we do not have old records, I would not recommend luminal evaluation this admission. The patient states he has had several upper endoscopies and colonoscopies and he very likely has anemia of chronic disease at this point. The patient has noted no blood or melena in the stool.    6. This case was discussed with Dr. Gustavo Lah in collaboration of care.   ____________________________ Janalyn Harder. Jerelene Redden, ANP kam:ap D: 10/08/2012 18:15:21 ET T: 10/09/2012 10:26:20 ET JOB#: [id number]  cc: Joelene Millin A. Jerelene Redden, ANP, <Dictator>  Janalyn Harder. Sherlyn Hay, MSN, ANP-BC Adult Nurse Practitioner ELECTRONICALLY SIGNED 10/11/2012 12:04

## 2015-03-06 NOTE — Consult Note (Signed)
Chief Complaint:   Subjective/Chief Complaint denies n/v or abdominal pain.  tolerating po with good apetite reported by nursing. main gi complaint is constipation.   VITAL SIGNS/ANCILLARY NOTES: **Vital Signs.:   24-Nov-13 11:48   Vital Signs Type Post Fall   Temperature Temperature (F) 98   Celsius 36.6   Pulse Pulse 78   Respirations Respirations 18   Systolic BP Systolic BP 353   Diastolic BP (mmHg) Diastolic BP (mmHg) 73   Pulse Ox % Pulse Ox % 97   Pulse Ox Activity Level  At rest   Oxygen Delivery Room Air/ 21 %  *Intake and Output.:   24-Nov-13 08:57   Percentage of Meal Eaten  100    13:06   Percentage of Meal Eaten  100   Brief Assessment:   Cardiac Regular    Respiratory clear BS    Gastrointestinal details normal Soft  Nontender  Nondistended  No masses palpable  Bowel sounds normal   Lab Results: Hepatic:  18-Nov-13 18:07    Albumin, Serum 4.2   Assessment/Plan:  Assessment/Plan:   Assessment 1) hisroty of s/p surgery for pancreatic Ca done 4/13.  please see my previous note.  Patient on pancreatic enzymes and is tolerating po very well.   2) multiple psych issues 3) constipation-on multiple agents, continue current.    Plan as above, will follow from a distance.   Electronic Signatures: Loistine Simas (MD)  (Signed [phone] 16:10)  Authored: Chief Complaint, VITAL SIGNS/ANCILLARY NOTES, Brief Assessment, Lab Results, Assessment/Plan   Last Updated: 24-Nov-13 16:10 by Loistine Simas (MD)

## 2015-03-06 NOTE — Consult Note (Signed)
Brief Consult Note: Diagnosis: History of pancreatic cancer.   Patient was seen by consultant.   Consult note dictated.   Comments: Recommend a low fat diet. Take the Creon in the middle of a meal. F/u with pancreatic surgeon in [place] on discharge. No complaints of pain/tenderness/or problems with gi system reported. Says constipation is well managed.  Weight loss could be d/t not eating but one small meal a day- "I don't know how to cook and my wife won't make anything for me". Group home placement in process. No role for luminal evaluation. Case d/w Dr. Gustavo Lah in collaboration of care.  Electronic Signatures: Gershon Mussel (NP)  (Signed [phone] 15:43)  Authored: Brief Consult Note   Last Updated: 22-Nov-13 15:43 by Gershon Mussel (NP)

## 2015-03-09 NOTE — Op Note (Signed)
PATIENT NAME:  [name], [name] MR#:  [id number] DATE OF BIRTH:  May 27, 1951  DATE OF PROCEDURE:  04/13/2013  PREOPERATIVE DIAGNOSIS:  Phimosis.   POSTOPERATIVE DIAGNOSIS:  Phimosis.   PROCEDURE:  Dorsal slit.   SURGEON:  John Giovanni, M.D.   ASSISTANT:  None.   ANESTHESIA:  MAC with local.   INDICATIONS:  A 64 year old male with inability to retract his foreskin.  After discussion of options, he has elected to proceed with dorsal slit.   DESCRIPTION OF PROCEDURE:  The patient is taken to the operating room and placed in the supine position.  IV sedation was obtained by anesthesia.  His external genitalia were prepped and draped in the usual fashion.  A dorsal penile block was performed with 9 mL of a 50/50 mixture of 1% plain Xylocaine and 0.5% plain Sensorcaine.  Penile anesthesia was achieved with this injection.  The distal prepuce was grasped with hemostats and placed on the stretch.  A Coker clamp was inserted and clamped on the dorsal prepuce to the corona radiata.  The clamp was removed and the skin was incised along this compression line to the corona radiata.  Small bleeders were coagulated with electrocautery.  The inner and outer preputial edges were reapproximated with a running 3-0 chromic suture.  Dressing of Vaseline gauze, conform mesh was applied.  The patient was taken to the PACU in stable condition.  No complications.  EBL minimal.     ____________________________ Ronda Fairly. Bernardo Heater, MD scs:ea D: 04/13/2013 14:54:33 ET T: 04/13/2013 23:01:36 ET JOB#: [id number]  cc: Nicki Reaper C. Bernardo Heater, MD, <Dictator> Abbie Sons MD ELECTRONICALLY SIGNED 04/18/2013 12:23

## 2015-03-15 ENCOUNTER — Emergency Department (HOSPITAL_COMMUNITY): Payer: Medicare Other

## 2015-03-15 ENCOUNTER — Encounter (HOSPITAL_COMMUNITY): Payer: Self-pay

## 2015-03-15 ENCOUNTER — Emergency Department (HOSPITAL_COMMUNITY)
Admission: EM | Admit: 2015-03-15 | Discharge: 2015-03-15 | Disposition: A | Discharge status: Home / Self Care | Payer: Medicare Other | Attending: Emergency Medicine | Admitting: Emergency Medicine

## 2015-03-15 DIAGNOSIS — K219 Gastro-esophageal reflux disease without esophagitis: Secondary | ICD-10-CM | POA: Diagnosis not present

## 2015-03-15 DIAGNOSIS — Z87442 Personal history of urinary calculi: Secondary | ICD-10-CM | POA: Insufficient documentation

## 2015-03-15 DIAGNOSIS — F329 Major depressive disorder, single episode, unspecified: Secondary | ICD-10-CM | POA: Insufficient documentation

## 2015-03-15 DIAGNOSIS — Z79899 Other long term (current) drug therapy: Secondary | ICD-10-CM | POA: Insufficient documentation

## 2015-03-15 DIAGNOSIS — D649 Anemia, unspecified: Secondary | ICD-10-CM | POA: Diagnosis not present

## 2015-03-15 DIAGNOSIS — H269 Unspecified cataract: Secondary | ICD-10-CM | POA: Insufficient documentation

## 2015-03-15 DIAGNOSIS — I129 Hypertensive chronic kidney disease with stage 1 through stage 4 chronic kidney disease, or unspecified chronic kidney disease: Secondary | ICD-10-CM | POA: Insufficient documentation

## 2015-03-15 DIAGNOSIS — F419 Anxiety disorder, unspecified: Secondary | ICD-10-CM | POA: Insufficient documentation

## 2015-03-15 DIAGNOSIS — Z8507 Personal history of malignant neoplasm of pancreas: Secondary | ICD-10-CM | POA: Diagnosis not present

## 2015-03-15 DIAGNOSIS — N189 Chronic kidney disease, unspecified: Secondary | ICD-10-CM | POA: Insufficient documentation

## 2015-03-15 DIAGNOSIS — Z86711 Personal history of pulmonary embolism: Secondary | ICD-10-CM | POA: Insufficient documentation

## 2015-03-15 DIAGNOSIS — M79671 Pain in right foot: Secondary | ICD-10-CM | POA: Diagnosis present

## 2015-03-15 DIAGNOSIS — E119 Type 2 diabetes mellitus without complications: Secondary | ICD-10-CM | POA: Insufficient documentation

## 2015-03-15 NOTE — ED Provider Notes (Signed)
CSN: [id number]     Arrival date & time 03/15/15  1853 History   First MD Initiated Contact with Patient 03/15/15 2000     Chief Complaint  Patient presents with  . Foot Pain     (Consider location/radiation/quality/duration/timing/severity/associated sxs/prior Treatment) Patient is a 64 y.o. male presenting with lower extremity pain. The history is provided by the patient.  Foot Pain This is a new problem. The current episode started yesterday. The problem occurs constantly. The problem has been gradually worsening.   [name] is a 64 y.o. male who presents to the ED with right foot pain that started yesterday. He states that at first he had pain in his lower leg but that went away and then he started having shooting pain in his right heel and foot. He does not want to take pain medication because he has renal disease.  Patient denies any injury to the right lower leg.   Past Medical History  Diagnosis Date  . Hypertension   . Diabetes mellitus, type II 2010  . Pancreatic adenocarcinoma 2011    Status post resection and chemotherapy; weight loss and malnutrition  . Depression   . Biliary obstruction 2012    Biliary stent replaced in 2012 secondary to reobstruction  . GERD (gastroesophageal reflux disease)   . Anemia   . 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 Cataracts, bilateral   . GI bleed   . Arachnoid cyst   . Chronic kidney disease     CRF  . Shortness of breath     with exertion  . Anxiety   . History of kidney stones   . History of blood transfusion   . PE (pulmonary embolism) 2011    "I think."  'I was on coumadin for a while, not sure how long or what MD."   Past Surgical History  Procedure Laterality Date  . Pancreatic resection  2011  . Ercp w/ plastic stent placement  2012    Dr. Laural Golden; stent replaced  .  Esophagogastroduodenoscopy  10/23/2012    Procedure: ESOPHAGOGASTRODUODENOSCOPY (EGD);  Surgeon: Rogene Houston, MD;  [facility];  Service: Endoscopy;  Laterality: N/A;  . Appendectomy    . Whipple procedure    . Colonoscopy with esophagogastroduodenoscopy (egd) N/A 04/27/2013    Procedure: COLONOSCOPY WITH ESOPHAGOGASTRODUODENOSCOPY (EGD);  Surgeon: Rogene Houston, MD;  [facility];  Service: Endoscopy;  Laterality: N/A;  . Cholecystectomy    . Eye surgery Bilateral   . Elbow surgery      due to fracture  . Radial head arthroplasty Right 07/26/2014    Procedure: RIGHT ELBOW RADIAL HEAD REPLACEMENT JOINT ARTHROTOMY DEBRIDEMENT ;  Surgeon: Linna Hoff, MD;  Location: [facility];  Service: Orthopedics;  Laterality: Right;   Family History  Problem Relation Age of Onset  . Coronary artery disease Mother 48  . Heart attack Father 44  . COPD Brother   . Diabetes Mellitus I Daughter    History  Substance Use Topics  . Smoking status: Never Smoker   . Smokeless tobacco: Never Used  . Alcohol Use: No    Review of Systems Negative except as stated in HPI   Allergies  Peanut-containing drug products  Home Medications   Prior to Admission medications   Medication Sig Start Date End Date Taking? Authorizing Provider  amitriptyline (ELAVIL) 10 MG  tablet Take 10 mg by mouth 2 (two) times daily.   Yes Historical Provider, MD  Ascorbic Acid (VITAMIN C WITH ROSE HIPS) 1000 MG tablet Take 1,000 mg by mouth daily.   Yes Historical Provider, MD  buPROPion (WELLBUTRIN XL) 150 MG 24 hr tablet Take 150 mg by mouth daily.   Yes Historical Provider, MD  calcitRIOL (ROCALTROL) 0.25 MCG capsule Take 1 capsule by mouth 3 (three) times a week. mon wed, fri 02/23/15  Yes Historical Provider, MD  ferrous sulfate 325 (65 FE) MG EC tablet Take 325 mg by mouth daily. 04/28/13  Yes Samuella Cota, MD  gabapentin (NEURONTIN) 100 MG capsule Take 100-200 mg by mouth 2 (two) times daily. 1  capsule (100 mg) in the am and 2 capsules (200 mg) in the evening 10/25/12  Yes Kathie Dike, MD  glipiZIDE (GLUCOTROL) 5 MG tablet Take 5 mg by mouth daily.   Yes Historical Provider, MD  labetalol (NORMODYNE) 300 MG tablet Take 300 mg by mouth daily. 02/22/15  Yes Historical Provider, MD  LORazepam (ATIVAN) 0.5 MG tablet Take 0.5 mg by mouth 2 (two) times daily.   Yes Historical Provider, MD  magnesium oxide (MAG-OX) 400 MG tablet Take 400 mg by mouth daily.   Yes Historical Provider, MD  metoprolol tartrate (LOPRESSOR) 25 MG tablet Take 25 mg by mouth 2 (two) times daily.    Yes Historical Provider, MD  Pancrelipase, Lip-Prot-Amyl, (ZENPEP) 10000 UNITS CPEP Take 3 capsules by mouth 3 (three) times daily.    Yes Historical Provider, MD  pantoprazole (PROTONIX) 40 MG tablet Take 1 tablet (40 mg total) by mouth daily. 10/25/12  Yes Kathie Dike, MD  pyridOXINE (VITAMIN B-6) 100 MG tablet Take 100 mg by mouth daily.   Yes Historical Provider, MD  vitamin A 8000 UNIT capsule Take 8,000 Units by mouth daily.   Yes Historical Provider, MD  vitamin E 400 UNIT capsule Take 400 Units by mouth daily.   Yes Historical Provider, MD   BP 162/76 mmHg  Pulse 76  Temp(Src) 98.3 F (36.8 C) (Oral)  Resp 18  Ht 6' (1.829 m)  Wt 194 lb (87.998 kg)  BMI 26.31 kg/m2  SpO2 100% Physical Exam  Constitutional: He is oriented to person, place, and time. He appears well-developed and well-nourished. No distress.  HENT:  Head: Normocephalic.  Eyes: Conjunctivae and EOM are normal.  Neck: Neck supple.  Cardiovascular: Normal rate.   Pulmonary/Chest: Effort normal.  Musculoskeletal: Normal range of motion.       Right foot: There is normal range of motion, no tenderness, no swelling, no deformity and no laceration.  Pedal pulse 2+, adequate circulation, good touch sensation, unable to reproduce the pain that the patient has when it comes.   Neurological: He is alert and oriented to person, place, and time. No  cranial nerve deficit.  Skin: Skin is warm and dry.  Psychiatric: He has a normal mood and affect. His behavior is normal.  Nursing note and vitals reviewed.   ED Course  Procedures (including critical care time) Labs Review Dr. Venora Maples in to examine the patient and discussed in detail with the patient clinical findings and follow up plan of care.  MDM  64 y.o. male with right foot pain. Stable for d/c without neurovascular compromise. Patient to follow up with neurologist for further evaluation. Discussed with the patient and all questioned fully answered.   Final diagnoses:  Foot pain, right       [facility] M  Janit Bern, NP 03/15/15 2130  Jola Schmidt, MD 03/15/15 [phone]

## 2015-03-15 NOTE — ED Notes (Signed)
I am having stabbing pain in my right heel and on top of my right foot. Last night I was having pain in my right upper leg per pt. Denies injury.

## 2015-03-15 NOTE — Discharge Instructions (Signed)
Your foot pain tonight may be due to nerve pain. You will need to follow up with the neurologist to have testing done for nerve studies.

## 2016-01-22 ENCOUNTER — Emergency Department (HOSPITAL_COMMUNITY): Payer: Medicare Other

## 2016-01-22 ENCOUNTER — Emergency Department (HOSPITAL_COMMUNITY)
Admission: EM | Admit: 2016-01-22 | Discharge: 2016-01-22 | Disposition: A | Discharge status: Home / Self Care | Payer: Medicare Other | Attending: Emergency Medicine | Admitting: Emergency Medicine

## 2016-01-22 ENCOUNTER — Encounter (HOSPITAL_COMMUNITY): Payer: Self-pay | Admitting: Emergency Medicine

## 2016-01-22 DIAGNOSIS — Z87442 Personal history of urinary calculi: Secondary | ICD-10-CM | POA: Insufficient documentation

## 2016-01-22 DIAGNOSIS — N189 Chronic kidney disease, unspecified: Secondary | ICD-10-CM | POA: Diagnosis not present

## 2016-01-22 DIAGNOSIS — R42 Dizziness and giddiness: Secondary | ICD-10-CM | POA: Insufficient documentation

## 2016-01-22 DIAGNOSIS — E869 Volume depletion, unspecified: Secondary | ICD-10-CM

## 2016-01-22 DIAGNOSIS — R531 Weakness: Secondary | ICD-10-CM | POA: Insufficient documentation

## 2016-01-22 DIAGNOSIS — R1011 Right upper quadrant pain: Secondary | ICD-10-CM | POA: Insufficient documentation

## 2016-01-22 DIAGNOSIS — Z91010 Allergy to peanuts: Secondary | ICD-10-CM | POA: Insufficient documentation

## 2016-01-22 DIAGNOSIS — Z7984 Long term (current) use of oral hypoglycemic drugs: Secondary | ICD-10-CM | POA: Diagnosis not present

## 2016-01-22 DIAGNOSIS — Z79899 Other long term (current) drug therapy: Secondary | ICD-10-CM | POA: Insufficient documentation

## 2016-01-22 DIAGNOSIS — I129 Hypertensive chronic kidney disease with stage 1 through stage 4 chronic kidney disease, or unspecified chronic kidney disease: Secondary | ICD-10-CM | POA: Diagnosis not present

## 2016-01-22 DIAGNOSIS — Z9049 Acquired absence of other specified parts of digestive tract: Secondary | ICD-10-CM | POA: Diagnosis not present

## 2016-01-22 DIAGNOSIS — E1122 Type 2 diabetes mellitus with diabetic chronic kidney disease: Secondary | ICD-10-CM | POA: Diagnosis not present

## 2016-01-22 DIAGNOSIS — F329 Major depressive disorder, single episode, unspecified: Secondary | ICD-10-CM | POA: Diagnosis not present

## 2016-01-22 LAB — CBC
HCT: 36.1 % — ABNORMAL LOW (ref 39.0–52.0)
Hemoglobin: 11.9 g/dL — ABNORMAL LOW (ref 13.0–17.0)
MCH: 29.8 pg (ref 26.0–34.0)
MCHC: 33 g/dL (ref 30.0–36.0)
MCV: 90.3 fL (ref 78.0–100.0)
PLATELETS: 149 10*3/uL — AB (ref 150–400)
RBC: 4 MIL/uL — AB (ref 4.22–5.81)
RDW: 15.3 % (ref 11.5–15.5)
WBC: 8.9 10*3/uL (ref 4.0–10.5)

## 2016-01-22 LAB — LIPASE, BLOOD: Lipase: 17 U/L (ref 11–51)

## 2016-01-22 LAB — HEPATIC FUNCTION PANEL
ALBUMIN: 4.2 g/dL (ref 3.5–5.0)
ALT: 31 U/L (ref 17–63)
AST: 33 U/L (ref 15–41)
Alkaline Phosphatase: 28 U/L — ABNORMAL LOW (ref 38–126)
BILIRUBIN TOTAL: 0.7 mg/dL (ref 0.3–1.2)
Bilirubin, Direct: 0.1 mg/dL (ref 0.1–0.5)
Indirect Bilirubin: 0.6 mg/dL (ref 0.3–0.9)
TOTAL PROTEIN: 7 g/dL (ref 6.5–8.1)

## 2016-01-22 LAB — BASIC METABOLIC PANEL
Anion gap: 7 (ref 5–15)
BUN: 41 mg/dL — ABNORMAL HIGH (ref 6–20)
CO2: 24 mmol/L (ref 22–32)
Calcium: 9.3 mg/dL (ref 8.9–10.3)
Chloride: 107 mmol/L (ref 101–111)
Creatinine, Ser: 2.97 mg/dL — ABNORMAL HIGH (ref 0.61–1.24)
GFR calc non Af Amer: 21 mL/min — ABNORMAL LOW (ref >60)
GFR, EST AFRICAN AMERICAN: 24 mL/min — AB (ref >60)
Glucose, Bld: 203 mg/dL — ABNORMAL HIGH (ref 65–99)
Potassium: 4.3 mmol/L (ref 3.5–5.1)
SODIUM: 138 mmol/L (ref 135–145)

## 2016-01-22 LAB — CBG MONITORING, ED: Glucose-Capillary: 189 mg/dL — ABNORMAL HIGH (ref 65–99)

## 2016-01-22 MED ORDER — SODIUM CHLORIDE 0.9 % IV SOLN
1000.0000 mL | Freq: Once | INTRAVENOUS | Status: AC
  Administered 2016-01-22: 1000 mL via INTRAVENOUS

## 2016-01-22 MED ORDER — SODIUM CHLORIDE 0.9 % IV SOLN: 1000.0000 mL | INTRAVENOUS | Status: DC

## 2016-01-22 NOTE — ED Provider Notes (Signed)
CSN: [id number]     Arrival date & time 01/22/16  1131 History  By signing my name below, I, Evelene Croon, attest that this documentation has been prepared under the direction and in the presence of Jola Schmidt, MD . Electronically Signed: Evelene Croon, Scribe. 01/22/2016. 12:53 PM.    Chief Complaint  Patient presents with  . Dizziness   The history is provided by the patient. No language interpreter was used.     HPI Comments:  [name] is a 65 y.o. male with a history of HTN, DM, who presents to the Emergency Department complaining of an episode of dizziness which began ~ 1 hour PTA while he was walking to his seat. He states soon after the dizziness began he felt a pain to the sides of his abdomen and then felt like he needed to have a BM. He notes some of his stool was diarrhea. He reports associated nausea. He denies vomiting. Pt also notes sore throat, rhinorrhea, congestion, and HA yesterday but states all have resolved except the sore throat. No alleviating factors noted.   Past Medical History  Diagnosis Date  . Hypertension   . Diabetes mellitus, type II ([place]) 2010  . Pancreatic adenocarcinoma ([place]) 2011    Status post resection and chemotherapy; weight loss and malnutrition  . Depression   . Biliary obstruction 2012    Biliary stent replaced in 2012 secondary to reobstruction  . GERD (gastroesophageal reflux disease)   . Anemia   . 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 Cataracts, bilateral   . GI bleed   . Arachnoid cyst   . Chronic kidney disease     CRF  . Shortness of breath     with exertion  . Anxiety   . History of kidney stones   . History of blood transfusion   . PE (pulmonary embolism) 2011    "I think."  'I was on coumadin for a while, not sure how long or what MD."   Past Surgical History  Procedure Laterality Date   . Pancreatic resection  2011  . Ercp w/ plastic stent placement  2012    Dr. Laural Golden; stent replaced  . Esophagogastroduodenoscopy  10/23/2012    Procedure: ESOPHAGOGASTRODUODENOSCOPY (EGD);  Surgeon: Rogene Houston, MD;  [facility];  Service: Endoscopy;  Laterality: N/A;  . Appendectomy    . Whipple procedure    . Colonoscopy with esophagogastroduodenoscopy (egd) N/A 04/27/2013    Procedure: COLONOSCOPY WITH ESOPHAGOGASTRODUODENOSCOPY (EGD);  Surgeon: Rogene Houston, MD;  [facility];  Service: Endoscopy;  Laterality: N/A;  . Cholecystectomy    . Eye surgery Bilateral   . Elbow surgery      due to fracture  . Radial head arthroplasty Right 07/26/2014    Procedure: RIGHT ELBOW RADIAL HEAD REPLACEMENT JOINT ARTHROTOMY DEBRIDEMENT ;  Surgeon: Linna Hoff, MD;  Location: [facility];  Service: Orthopedics;  Laterality: Right;   Family History  Problem Relation Age of Onset  . Coronary artery disease Mother 76  . Heart attack Father 29  . COPD Brother   . Diabetes Mellitus I Daughter    Social History  Substance Use Topics  . Smoking status: Never Smoker   . Smokeless tobacco: Never Used  . Alcohol Use: No    Review of Systems  10 systems reviewed and all are negative for  acute change except as noted in the HPI.  Allergies  Peanut-containing drug products  Home Medications   Prior to Admission medications   Medication Sig Start Date End Date Taking? Authorizing Provider  amitriptyline (ELAVIL) 10 MG tablet Take 10 mg by mouth 2 (two) times daily.    Historical Provider, MD  Ascorbic Acid (VITAMIN C WITH ROSE HIPS) 1000 MG tablet Take 1,000 mg by mouth daily.    Historical Provider, MD  buPROPion (WELLBUTRIN XL) 150 MG 24 hr tablet Take 150 mg by mouth daily.    Historical Provider, MD  calcitRIOL (ROCALTROL) 0.25 MCG capsule Take 1 capsule by mouth 3 (three) times a week. mon wed, fri 02/23/15   Historical Provider, MD  ferrous sulfate 325 (65 FE) MG EC  tablet Take 325 mg by mouth daily. 04/28/13   Samuella Cota, MD  gabapentin (NEURONTIN) 100 MG capsule Take 100-200 mg by mouth 2 (two) times daily. 1 capsule (100 mg) in the am and 2 capsules (200 mg) in the evening 10/25/12   Kathie Dike, MD  glipiZIDE (GLUCOTROL) 5 MG tablet Take 5 mg by mouth daily.    Historical Provider, MD  labetalol (NORMODYNE) 300 MG tablet Take 300 mg by mouth daily. 02/22/15   Historical Provider, MD  LORazepam (ATIVAN) 0.5 MG tablet Take 0.5 mg by mouth 2 (two) times daily.    Historical Provider, MD  magnesium oxide (MAG-OX) 400 MG tablet Take 400 mg by mouth daily.    Historical Provider, MD  metoprolol tartrate (LOPRESSOR) 25 MG tablet Take 25 mg by mouth 2 (two) times daily.     Historical Provider, MD  Pancrelipase, Lip-Prot-Amyl, (ZENPEP) 10000 UNITS CPEP Take 3 capsules by mouth 3 (three) times daily.     Historical Provider, MD  pantoprazole (PROTONIX) 40 MG tablet Take 1 tablet (40 mg total) by mouth daily. 10/25/12   Kathie Dike, MD  pyridOXINE (VITAMIN B-6) 100 MG tablet Take 100 mg by mouth daily.    Historical Provider, MD  vitamin A 8000 UNIT capsule Take 8,000 Units by mouth daily.    Historical Provider, MD  vitamin E 400 UNIT capsule Take 400 Units by mouth daily.    Historical Provider, MD   BP 120/71 mmHg  Pulse 66  Temp(Src) 97.8 F (36.6 C) (Oral)  Resp 13  Ht 6' (1.829 m)  Wt 180 lb (81.647 kg)  BMI 24.41 kg/m2  SpO2 97% Physical Exam  Constitutional: He is oriented to person, place, and time. He appears well-developed and well-nourished.  HENT:  Head: Normocephalic and atraumatic.  Mouth/Throat: Uvula is midline. No oropharyngeal exudate, posterior oropharyngeal edema or posterior oropharyngeal erythema.  Eyes: EOM are normal.  Neck: Normal range of motion.  Cardiovascular: Normal rate, regular rhythm, normal heart sounds and intact distal pulses.   Pulmonary/Chest: Effort normal and breath sounds normal. No respiratory distress.   Abdominal: Soft. He exhibits no distension. There is tenderness (mild RUQ).  Musculoskeletal: Normal range of motion.  Neurological: He is alert and oriented to person, place, and time.  Skin: Skin is warm and dry.  Psychiatric: He has a normal mood and affect. Judgment normal.  Nursing note and vitals reviewed.   ED Course  Procedures   DIAGNOSTIC STUDIES:  Oxygen Saturation is 95% on RA, normal by my interpretation.    COORDINATION OF CARE:  12:24 PM Discussed treatment plan with pt at bedside and pt agreed to plan.  Labs Review Labs Reviewed  BASIC METABOLIC PANEL - Abnormal;  Notable for the following:    Glucose, Bld 203 (*)    BUN 41 (*)    Creatinine, Ser 2.97 (*)    GFR calc non Af Amer 21 (*)    GFR calc Af Amer 24 (*)    All other components within normal limits  CBC - Abnormal; Notable for the following:    RBC 4.00 (*)    Hemoglobin 11.9 (*)    HCT 36.1 (*)    Platelets 149 (*)    All other components within normal limits  HEPATIC FUNCTION PANEL - Abnormal; Notable for the following:    Alkaline Phosphatase 28 (*)    All other components within normal limits  CBG MONITORING, ED - Abnormal; Notable for the following:    Glucose-Capillary 189 (*)    All other components within normal limits  LIPASE, BLOOD  URINALYSIS, ROUTINE W REFLEX MICROSCOPIC (NOT AT [facility])   BUN  Date Value Ref Range Status  01/22/2016 41* 6 - 20 mg/dL Final  07/26/2014 37* 6 - 23 mg/dL Final  07/26/2014 39* 6 - 23 mg/dL Final  07/26/2014 38* 6 - 23 mg/dL Final  10/04/2012 27* 7-18 mg/dL Final  09/01/2012 34* 7-18 mg/dL Final  08/28/2012 26* 7-18 mg/dL Final   CREATININE  Date Value Ref Range Status  10/04/2012 1.75* 0.60-1.30 mg/dL Final  09/01/2012 2.22* 0.60-1.30 mg/dL Final  08/28/2012 1.89* 0.60-1.30 mg/dL Final   CREATININE, SER  Date Value Ref Range Status  01/22/2016 2.97* 0.61 - 1.24 mg/dL Final  07/26/2014 2.89* 0.50 - 1.35 mg/dL Final  07/26/2014 3.21* 0.50 -  1.35 mg/dL Final  07/26/2014 3.40* 0.50 - 1.35 mg/dL Final       Imaging Review No results found. I have personally reviewed and evaluated these images and lab results as part of my medical decision-making.   EKG Interpretation   Date/Time:  Tuesday January 22 2016 11:38:18 EST Ventricular Rate:  68 PR Interval:  192 QRS Duration: 126 QT Interval:  428 QTC Calculation: 455 R Axis:   -47 Text Interpretation:  Normal sinus rhythm Left axis deviation Left  ventricular hypertrophy with QRS widening Cannot rule out Septal infarct ,  age undetermined Abnormal ECG No significant change was found Confirmed by  Dawnielle Christiana  MD, Lennette Bihari ([id number]) on 01/22/2016 12:26:10 PM      MDM   Final diagnoses:  None    1:58 PM Pt feels better. Repeat abdominal exam without tenderness. Ambulatory in ER without lightheadedness. Labs without significant abnormality. Dc home with pcp follow up.   I personally performed the services described in this documentation, which was scribed in my presence. The recorded information has been reviewed and is accurate.      Jola Schmidt, MD 01/22/16 1400

## 2016-01-22 NOTE — ED Notes (Signed)
Pt made aware a urine specimen was needed. Pt verbalized understanding and will notify staff when one can be obtained.   

## 2016-01-22 NOTE — ED Notes (Signed)
Pt c/o dizziness which started one hour ago while eating. Pt states he had a BM immediately after. Pt denies chest pain, SOB. Pt advises he has had cold symptoms since yesterday. Pt denies dizziness while sitting down.

## 2016-01-22 NOTE — ED Notes (Signed)
Also c/o R side abd pain.

## 2016-06-15 IMAGING — DX DG CHEST 2V
2 series · 2 of 2 positions shown · non-contrast
Comparison: PA and lateral chest 07/18/2014.

CLINICAL DATA: Dizziness and syncope this morning. Right side
abdominal pain. History of pancreatic cancer. Initial encounter.

EXAM:
CHEST  2 VIEW

[chest pa]
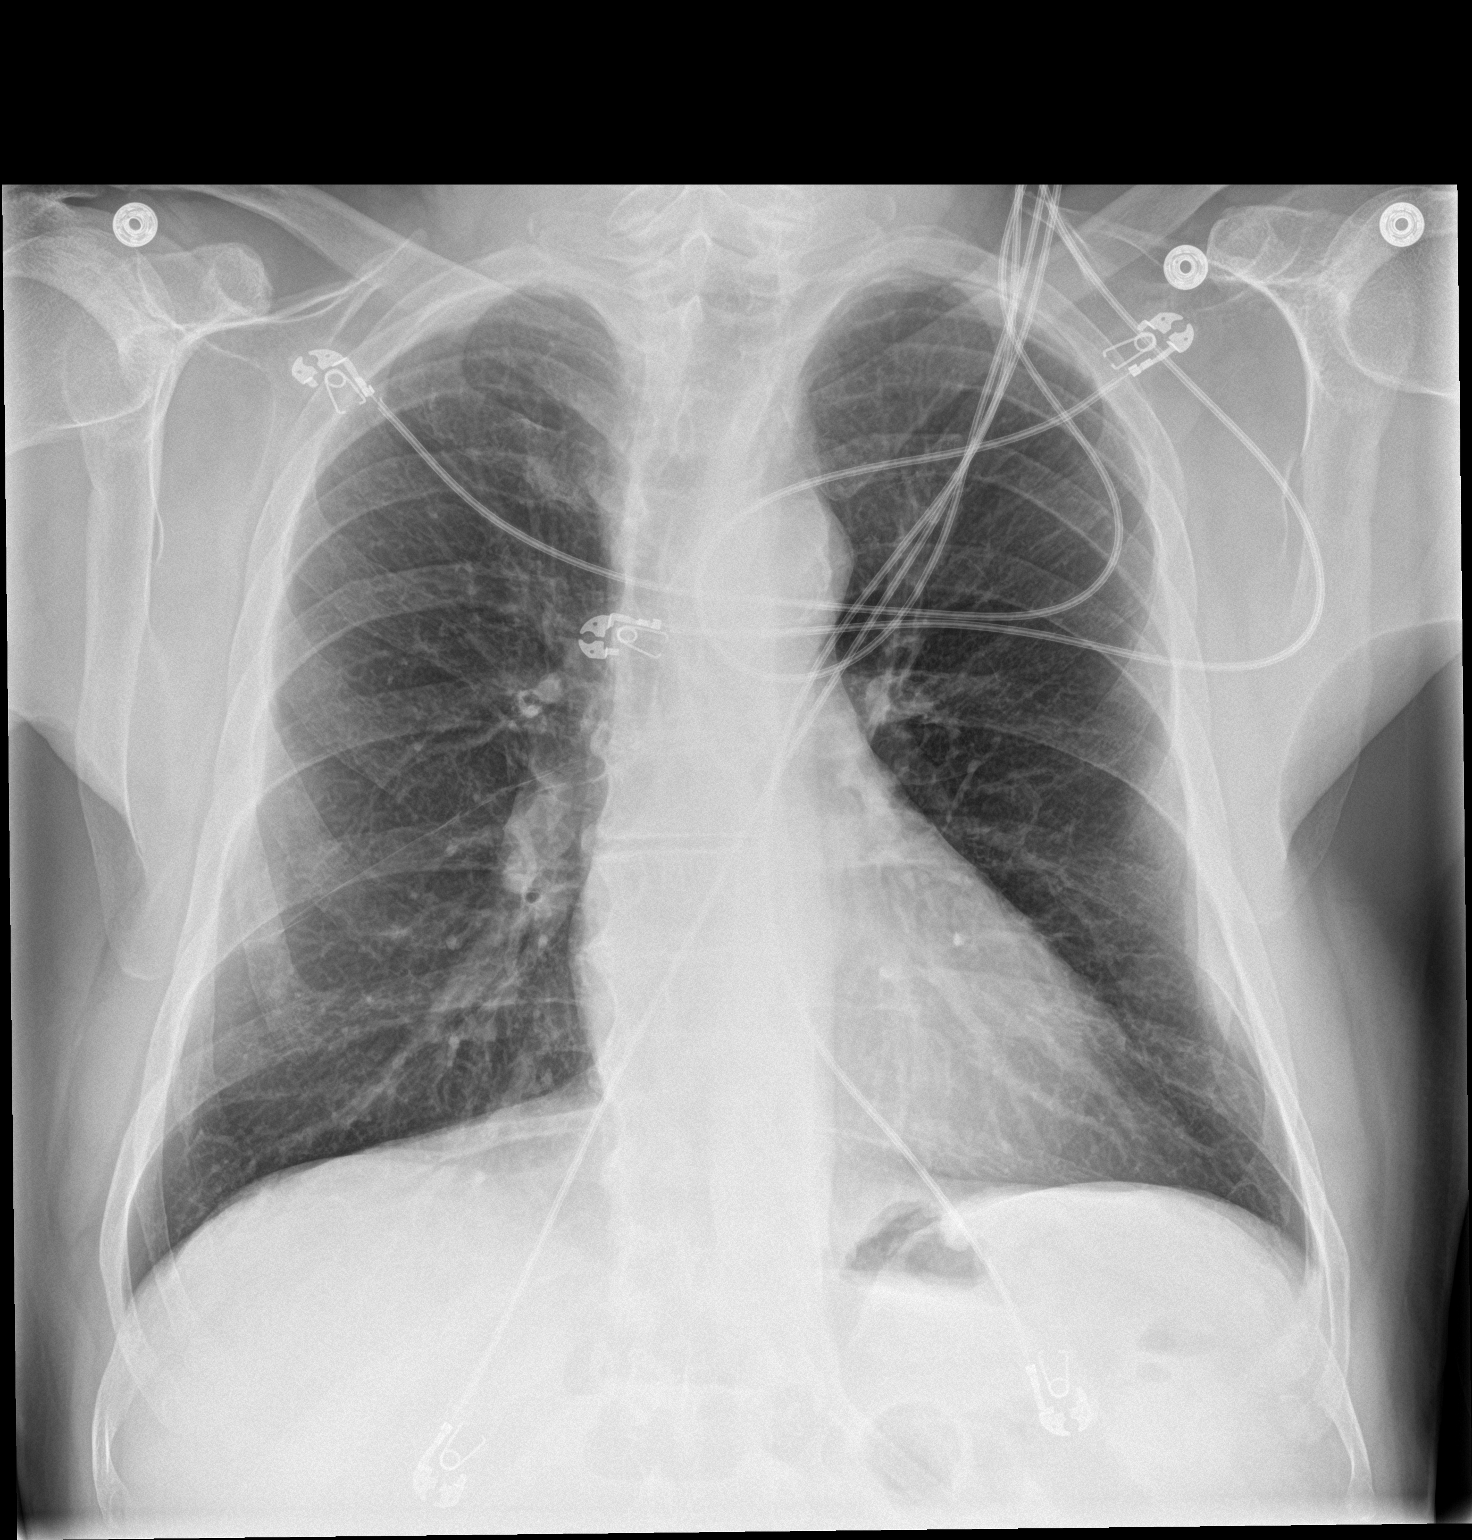

[chest lat]
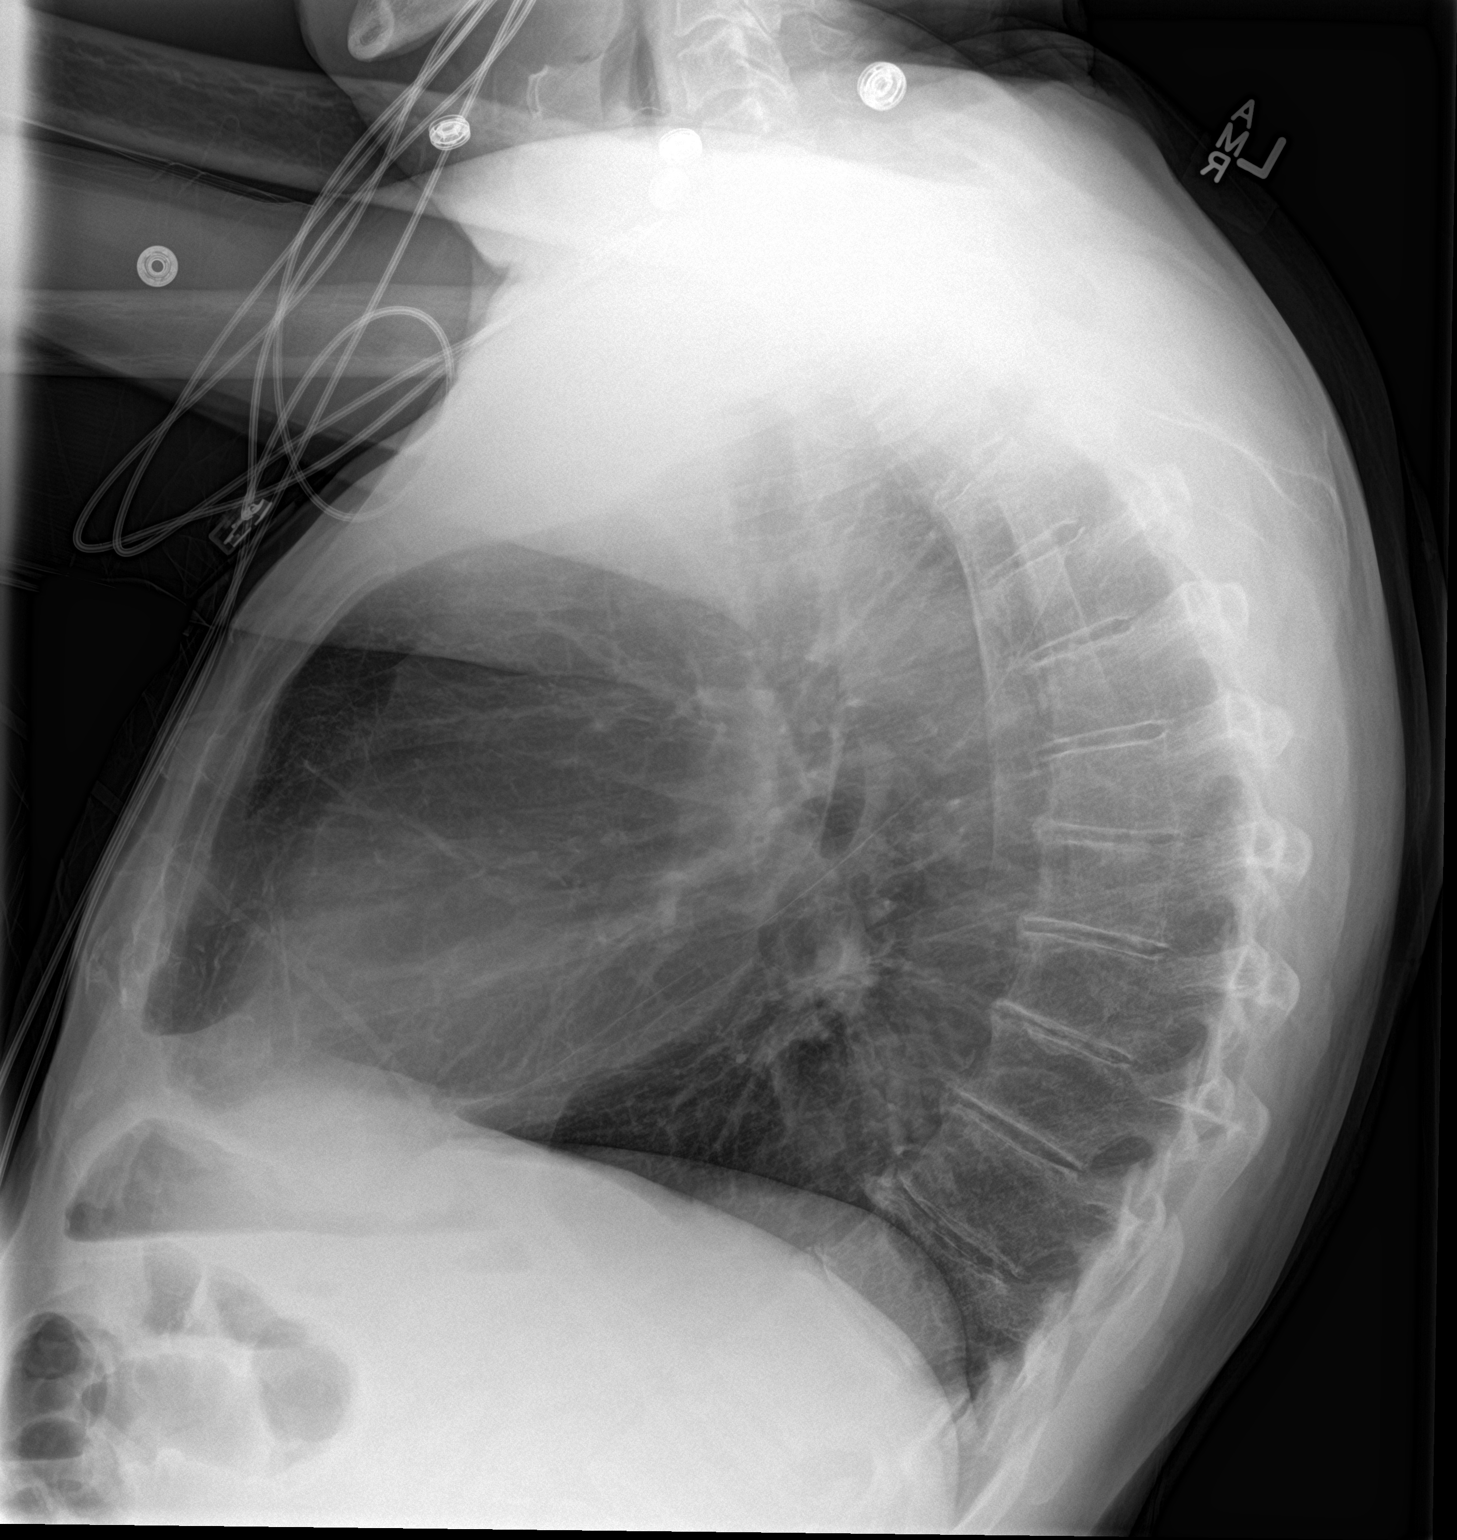

[2 of 2 positions shown; findings below may reference images not displayed]

FINDINGS: The lungs are clear. Heart size is normal. No pneumothorax or
pleural effusion. Atherosclerosis is seen. Thoracic spondylosis
noted.
IMPRESSION: No acute disease.

## 2016-12-23 ENCOUNTER — Encounter: Payer: Self-pay | Admitting: Internal Medicine

## 2017-12-18 DEATH — deceased

## 2018-04-27 ENCOUNTER — Encounter (INDEPENDENT_AMBULATORY_CARE_PROVIDER_SITE_OTHER): Payer: Self-pay | Admitting: *Deleted
